# Patient Record
Sex: Female | Born: 1950 | Race: White | Hispanic: Yes | Marital: Married | Smoking: Never smoker
Health system: Southern US, Community
[De-identification: ages and names within clinical notes are randomized; demographics above are authoritative.]

## PROBLEM LIST (undated history)

## (undated) DIAGNOSIS — F32A Depression, unspecified: Secondary | ICD-10-CM

## (undated) DIAGNOSIS — F329 Major depressive disorder, single episode, unspecified: Secondary | ICD-10-CM

## (undated) DIAGNOSIS — K219 Gastro-esophageal reflux disease without esophagitis: Secondary | ICD-10-CM

## (undated) DIAGNOSIS — E119 Type 2 diabetes mellitus without complications: Secondary | ICD-10-CM

## (undated) DIAGNOSIS — C3490 Malignant neoplasm of unspecified part of unspecified bronchus or lung: Secondary | ICD-10-CM

## (undated) DIAGNOSIS — C50919 Malignant neoplasm of unspecified site of unspecified female breast: Secondary | ICD-10-CM

## (undated) DIAGNOSIS — M1990 Unspecified osteoarthritis, unspecified site: Secondary | ICD-10-CM

## (undated) DIAGNOSIS — C189 Malignant neoplasm of colon, unspecified: Secondary | ICD-10-CM

## (undated) DIAGNOSIS — F419 Anxiety disorder, unspecified: Secondary | ICD-10-CM

## (undated) HISTORY — DX: Depression, unspecified: F32.A

## (undated) HISTORY — DX: Unspecified osteoarthritis, unspecified site: M19.90

## (undated) HISTORY — DX: Type 2 diabetes mellitus without complications: E11.9

## (undated) HISTORY — PX: CATARACT EXTRACTION W/ INTRAOCULAR LENS IMPLANT: SHX1309

## (undated) HISTORY — PX: CHOLECYSTECTOMY: SHX55

## (undated) HISTORY — DX: Gastro-esophageal reflux disease without esophagitis: K21.9

## (undated) HISTORY — DX: Anxiety disorder, unspecified: F41.9

## (undated) HISTORY — DX: Malignant neoplasm of colon, unspecified: C18.9

## (undated) HISTORY — PX: COLON SURGERY: SHX602

## (undated) HISTORY — PX: MASTECTOMY: SHX3

## (undated) HISTORY — PX: CATARACT EXTRACTION: SUR2

## (undated) HISTORY — DX: Major depressive disorder, single episode, unspecified: F32.9

---

## 2014-06-07 HISTORY — PX: COLONOSCOPY: SHX174

## 2017-03-07 DIAGNOSIS — C50919 Malignant neoplasm of unspecified site of unspecified female breast: Secondary | ICD-10-CM

## 2017-03-07 HISTORY — DX: Malignant neoplasm of unspecified site of unspecified female breast: C50.919

## 2017-09-24 ENCOUNTER — Other Ambulatory Visit (HOSPITAL_COMMUNITY): Payer: Self-pay | Admitting: Internal Medicine

## 2017-09-24 DIAGNOSIS — R52 Pain, unspecified: Secondary | ICD-10-CM

## 2017-09-24 DIAGNOSIS — Z853 Personal history of malignant neoplasm of breast: Secondary | ICD-10-CM

## 2017-10-08 ENCOUNTER — Encounter (HOSPITAL_COMMUNITY): Payer: Self-pay

## 2017-10-08 ENCOUNTER — Ambulatory Visit (HOSPITAL_COMMUNITY): Payer: Self-pay

## 2017-10-21 ENCOUNTER — Encounter (HOSPITAL_COMMUNITY): Payer: Self-pay

## 2017-11-04 ENCOUNTER — Ambulatory Visit (HOSPITAL_COMMUNITY)
Admission: RE | Admit: 2017-11-04 | Discharge: 2017-11-04 | Disposition: A | Discharge status: Home / Self Care | Payer: Medicare Other | Source: Ambulatory Visit | Attending: Internal Medicine | Admitting: Internal Medicine

## 2017-11-04 ENCOUNTER — Encounter (HOSPITAL_COMMUNITY): Payer: Self-pay | Admitting: Radiology

## 2017-11-04 ENCOUNTER — Other Ambulatory Visit (HOSPITAL_COMMUNITY): Payer: Self-pay | Admitting: Internal Medicine

## 2017-11-04 DIAGNOSIS — N644 Mastodynia: Secondary | ICD-10-CM | POA: Insufficient documentation

## 2017-11-04 DIAGNOSIS — R52 Pain, unspecified: Secondary | ICD-10-CM

## 2017-11-04 DIAGNOSIS — R928 Other abnormal and inconclusive findings on diagnostic imaging of breast: Secondary | ICD-10-CM | POA: Diagnosis present

## 2017-11-04 DIAGNOSIS — Z853 Personal history of malignant neoplasm of breast: Secondary | ICD-10-CM

## 2017-11-04 HISTORY — DX: Malignant neoplasm of unspecified site of unspecified female breast: C50.919

## 2017-12-24 ENCOUNTER — Encounter: Payer: Self-pay | Admitting: Internal Medicine

## 2018-03-05 ENCOUNTER — Encounter: Payer: Self-pay | Admitting: Gastroenterology

## 2018-03-05 ENCOUNTER — Other Ambulatory Visit: Payer: Self-pay

## 2018-03-05 ENCOUNTER — Telehealth: Payer: Self-pay

## 2018-03-05 ENCOUNTER — Ambulatory Visit (INDEPENDENT_AMBULATORY_CARE_PROVIDER_SITE_OTHER): Payer: Medicare Other | Admitting: Gastroenterology

## 2018-03-05 ENCOUNTER — Encounter

## 2018-03-05 VITALS — BP 146/72 | HR 62 | Temp 97.1°F | Ht 64.0 in | Wt 244.8 lb

## 2018-03-05 DIAGNOSIS — Z85038 Personal history of other malignant neoplasm of large intestine: Secondary | ICD-10-CM | POA: Diagnosis not present

## 2018-03-05 DIAGNOSIS — R1013 Epigastric pain: Secondary | ICD-10-CM | POA: Diagnosis not present

## 2018-03-05 DIAGNOSIS — Z853 Personal history of malignant neoplasm of breast: Secondary | ICD-10-CM

## 2018-03-05 NOTE — Telephone Encounter (Signed)
Tried to call pt's daughter Romero Belling) to inform of pre-op appt 04/16/18 at 1:45pm, no answer, LMOVM. Letter mailed.

## 2018-03-05 NOTE — Progress Notes (Signed)
Primary Care Physician:  Celene Squibb, MD Primary Gastroenterologist:  Dr. Gala Romney   Chief Complaint  Patient presents with  . Abdominal Pain    HPI:   Carrie Robbins is a 67 y.o. female presenting today at the request of Dr. [place] Crane secondary to abdominal pain. She is present with her two daughters today. Patient with history of ECT many years ago with resulting deficits per daughters; they state she has the mentality of a young child with understanding. She also has a significant past medical history for colon cancer at least 10 years ago, s/p surgical resection per daughters. No chemo or radiation per daughters as they were told she would not tolerate this. Diagnosed in [place]. Colonoscopy last in 2015 normal in [place], records reviewed and to be scanned into epic. She was also diagnosed with breast cancer in June 2018, s/p mastectomy. No therapy post-surgery per daughters, as they felt any treatment would not promote quality of life. I have no records at time of visit. Surgery for breast cancer completed in [place], [place], last year. Family moved to this area in Oct 2018 after losing their home and belongings after [facility].    Notes several month history of postprandial abdominal pain. PPI increased to BID but without improvement. No N/V. Bland diet without improvement. Denies melena, hematochezia, constipation, diarrhea, dysphagia. Has been taking Naproxen.   Had been on several different types of antibiotics and seemed to get better when treated empirically for diverticulitis March 2019. Shehas lost 20 lbs since March 2019. Weights in office from outside records March 2019 at 265 lbs, today is 244.   Has lost 20 lbs since March 2019. Changed diet.   Past Medical History:  Diagnosis Date  . Anxiety   . Arthritis   . Breast cancer ([place]) 03/2017   left breast, [place]  . Cancer ([place])   . Colon cancer ([place])    about 10 years ago  . Depression   . Diabetes ([place])   . GERD  (gastroesophageal reflux disease)     Past Surgical History:  Procedure Laterality Date  . CATARACT EXTRACTION    . CATARACT EXTRACTION W/ INTRAOCULAR LENS IMPLANT    . CESAREAN SECTION     X 2  . CHOLECYSTECTOMY    . COLON SURGERY     10 years ago, colon cancer  . COLONOSCOPY  06/2014   [place]: normal-appearing anastomosis at hepatic flexure, small internal hemorrhoids, otherwise normal, surveillance 2020  . MASTECTOMY Left    with lymph node dissection    Current Outpatient Medications  Medication Sig Dispense Refill  . aspirin EC 81 MG tablet Take 81 mg by mouth daily.    Marland Kitchen atorvastatin (LIPITOR) 20 MG tablet Take 1 tablet by mouth daily.    . busPIRone (BUSPAR) 10 MG tablet Take 10 mg by mouth 3 (three) times daily.    . clonazePAM (KLONOPIN) 0.5 MG tablet Take 3 tablets by mouth daily.    Marland Kitchen FLUoxetine (PROZAC) 20 MG tablet 4 tablets daily    . fluticasone (FLONASE) 50 MCG/ACT nasal spray 2 sprays daily.    Marland Kitchen glimepiride (AMARYL) 4 MG tablet Take 2 tablets by mouth daily.    Marland Kitchen lisinopril-hydrochlorothiazide (PRINZIDE,ZESTORETIC) 20-25 MG tablet Take 1 tablet by mouth daily.  5  . naproxen (NAPROSYN) 375 MG tablet Take 2 tablets by mouth daily.    Marland Kitchen omeprazole (PRILOSEC) 40 MG capsule Take 40 mg by mouth 2 (two) times daily before a  meal.    . risperiDONE (RISPERDAL) 1 MG tablet Take 1 tablet by mouth daily.    . TRADJENTA 5 MG TABS tablet Take 1 tablet by mouth daily.  5  . traZODone (DESYREL) 50 MG tablet Take 1.5 tablets by mouth daily.    . TRESIBA FLEXTOUCH 100 UNIT/ML SOPN FlexTouch Pen 35 Units daily.     No current facility-administered medications for this visit.     Allergies as of 03/05/2018 - Review Complete 03/05/2018  Allergen Reaction Noted  . Combivent respimat [ipratropium-albuterol]  03/05/2018  . Excedrin extra strength [asa-apap-caff buffered]  03/05/2018  . Metformin and related  03/05/2018    Family History  Problem Relation Age of Onset  .  Stomach cancer Mother   . Liver cancer Maternal Grandmother   . Colon cancer Neg Hx     Social History   Socioeconomic History  . Marital status: Married    Spouse name: Not on file  . Number of children: Not on file  . Years of education: Not on file  . Highest education level: Not on file  Occupational History  . Not on file  Social Needs  . Financial resource strain: Not on file  . Food insecurity:    Worry: Not on file    Inability: Not on file  . Transportation needs:    Medical: Not on file    Non-medical: Not on file  Tobacco Use  . Smoking status: Never Smoker  . Smokeless tobacco: Never Used  Substance and Sexual Activity  . Alcohol use: Never    Frequency: Never  . Drug use: Never  . Sexual activity: Not on file  Lifestyle  . Physical activity:    Days per week: Not on file    Minutes per session: Not on file  . Stress: Not on file  Relationships  . Social connections:    Talks on phone: Not on file    Gets together: Not on file    Attends religious service: Not on file    Active member of club or organization: Not on file    Attends meetings of clubs or organizations: Not on file    Relationship status: Not on file  . Intimate partner violence:    Fear of current or ex partner: Not on file    Emotionally abused: Not on file    Physically abused: Not on file    Forced sexual activity: Not on file  Other Topics Concern  . Not on file  Social History Narrative  . Not on file    Review of Systems: Gen: see HPI   CV: Denies chest pain, heart palpitations, peripheral edema, syncope.  Resp: Denies shortness of breath at rest or with exertion. Denies wheezing or cough.  GI: see HPI  GU : Denies urinary burning, urinary frequency, urinary hesitancy MS: Denies joint pain, muscle weakness, cramps, or limitation of movement.  Derm: Denies rash, itching, dry skin Psych: Denies depression, anxiety, memory loss, and confusion Heme: Denies bruising,  bleeding, and enlarged lymph nodes.  Physical Exam: BP (!) 146/72   Pulse 62   Temp (!) 97.1 F (36.2 C) (Oral)   Ht 5\' 4"  (1.626 m)   Wt 244 lb 12.8 oz (111 kg)   BMI 42.02 kg/m  General:   Alert and oriented. Pleasant and cooperative. Well-nourished and well-developed.  Head:  Normocephalic and atraumatic. Eyes:  Without icterus, sclera clear and conjunctiva pink.  Ears:  Normal auditory acuity. Nose:  No  deformity, discharge,  or lesions. Lungs:  Clear to auscultation bilaterally. No wheezes, rales, or rhonchi. No distress.  Heart:  S1, S2 present without murmurs appreciated.  Abdomen:  +BS, soft, obese, non-tender and non-distended. No HSM noted. No guarding or rebound. No masses appreciated.  Rectal:  Deferred  Msk:  Symmetrical without gross deformities. Normal posture. Extremities:  Withoutedema. Neurologic:  Alert and  oriented x4 Psych:  Alert and cooperative. Normal mood and affect.

## 2018-03-05 NOTE — Patient Instructions (Signed)
I have ordered a CT scan first. Unless there is anything that points [place] in a different direction, we will still pursue the upper endoscopy with Dr. Gala Romney in the near future thereafter.  I would limit Naproxen if at all possible.  Continue the Prilosec twice a day.   I will attempt to get all prior records. You will need a colonoscopy in the future, but it does not appear to be due yet.  I am referring you to Oncology here at [facility] to establish care, as you will need to be followed over time.  Further recommendations to follow after the CT scan!  It was a pleasure to see you today. I strive to create trusting relationships with patients to provide genuine, compassionate, and quality care. I value your feedback. If you receive a survey regarding your visit,  I greatly appreciate you taking time to fill this out.   Annitta Needs, PhD, ANP-BC [facility]

## 2018-03-09 ENCOUNTER — Telehealth: Payer: Self-pay

## 2018-03-09 NOTE — Telephone Encounter (Signed)
AB wanted pt to have CT abd/pelvis w/contrast this week. Tried to call pt's daughter ([place]),no answer, LMOVM for return call.

## 2018-03-10 ENCOUNTER — Other Ambulatory Visit: Payer: Self-pay

## 2018-03-10 DIAGNOSIS — R1013 Epigastric pain: Secondary | ICD-10-CM

## 2018-03-10 NOTE — Telephone Encounter (Signed)
Spoke to pt's daughter. She is unable to have CT today but can go tomorrow after another 10:00am appt.  Called Central Scheduling, CT abd/pelvis w/contrast scheduled 03/11/18 at 12:30pm. Water only for 4 hours before test. Called daughter back and informed her of appt. Will arrive after other 10:00am appt to start drinking contrast.

## 2018-03-10 NOTE — Telephone Encounter (Signed)
Tried to call daughter, no answer, LMOVM for return call to schedule CT.

## 2018-03-11 ENCOUNTER — Ambulatory Visit (HOSPITAL_COMMUNITY)
Admission: RE | Admit: 2018-03-11 | Discharge: 2018-03-11 | Disposition: A | Discharge status: Home / Self Care | Payer: Medicare Other | Source: Ambulatory Visit | Attending: Gastroenterology | Admitting: Gastroenterology

## 2018-03-11 ENCOUNTER — Telehealth: Payer: Self-pay

## 2018-03-11 DIAGNOSIS — R918 Other nonspecific abnormal finding of lung field: Secondary | ICD-10-CM | POA: Diagnosis not present

## 2018-03-11 DIAGNOSIS — R1013 Epigastric pain: Secondary | ICD-10-CM | POA: Insufficient documentation

## 2018-03-11 DIAGNOSIS — I700 Atherosclerosis of aorta: Secondary | ICD-10-CM | POA: Insufficient documentation

## 2018-03-11 LAB — POCT I-STAT CREATININE: Creatinine, Ser: 1.2 mg/dL — ABNORMAL HIGH (ref 0.44–1.00)

## 2018-03-11 MED ORDER — IOPAMIDOL (ISOVUE-300) INJECTION 61%
100.0000 mL | Freq: Once | INTRAVENOUS | Status: AC | PRN
  Administered 2018-03-11: 100 mL via INTRAVENOUS

## 2018-03-11 NOTE — Telephone Encounter (Signed)
Received a call from [facility] in reference to pts CT results. Results will be faxed over. Results show possible metastasis. Orders will be placed on desk.

## 2018-03-12 NOTE — Telephone Encounter (Signed)
Noted. Addressed under result note.

## 2018-03-13 ENCOUNTER — Encounter: Payer: Self-pay | Admitting: Gastroenterology

## 2018-03-13 NOTE — Assessment & Plan Note (Signed)
Barring clinical changes, next surveillance in 2020 if health permits.

## 2018-03-13 NOTE — Assessment & Plan Note (Signed)
67 year old female with several month history of postprandial abdominal pain, no improvement with PPI, and documented weight loss. History significant for colon cancer diagnosed in another state at least 10 years ago, s/p surgical resection. Colonoscopy on file from [place] in 2015 normal. Also notable history of breast cancer in 2018 s/p mastectomy, with the choice made to avoid any chemo or radiation due to possible affect on quality of life (history of ECT in remote past with cognitive deficits). Weight loss is concerning, and I do not have any records here from prior oncology visits. Unfortunately, family lost their home in [place], [place], after [place], so care has been fragmented.   Will pursue CT now due to known history of cancers, weight loss.   Refer to Oncology to establish care Requesting all records from [facility] (where last seen by oncology last year) Pursue EGD with Dr. Gala Romney in near future. Risks, benefits, alternatives discussed with stated understanding. Propofol due to polypharmacy Continue PPI

## 2018-03-13 NOTE — Assessment & Plan Note (Signed)
Diagnosed 2018 and per daughters was metastatic. No chemo or radiation at family's request. Needs to establish care with Oncology, specifically to assess extent of disease and long-term plan especially if no treatment will be pursued. Attempting to obtain outside records.

## 2018-03-16 NOTE — Progress Notes (Signed)
CC'D TO PCP °

## 2018-03-18 NOTE — Progress Notes (Signed)
Diagnosed in 2008 with colon cancer. Underwent right hemicolectomy 2008. Final path moderately differentiated adenocarcinoma, eighteen lymph nodes negative. Colonoscopy 2009 all unremarkable. 2010 colonoscopy unremarkable.No chemo at that time. Colonoscopy 2015 in [place]: normal-appearing anastomosis at hepatic flexure. Small internal hemorrhoids.   11/2016 ultrasound-guided biopsy of left breast lesion: invasive ductal carcinoma, moderately differentiated, extensive necrosis, triple negative.   PET CT March 0375: hypermetabolic left breast mass and associated left axillary hypermetabolic lymphadenopathy, multiple indeterminate left lower lobe pulmonary nodules measuring up to 8 mm without associated significant FDG uptake but given small size may be at limits of detection of FDG activity.   PET CT May 2018: grossly stable but persistent activity in skin of left breast, the left breast mass, and 2 left axillary nodes.   03/2017 mastectomy. High-grade invasive ductal carcinoma, primary 6 cm diameter, margins negative, 3 of 14 axillary nodes positive. Stage IIB.   Family and patient decided against treatment of metastatic breast cancer.    Recent labs from PCP dated April 2019: Hgb 12.1, Cr 1.16, BUN 24, Albumin 3.5, Tbili 0.2, Alk Phos 73, AST 10, ALT 9, A1c 8.5.   Barring any clinical changes, pursue high risk surveillance colonoscopy in 2020 if health permits. Proceed with EGD. Establish care with Oncology here. Appears she did not desire treatment in 2008 for colon cancer or most recently 2018 with metastatic breast cancer. CT abd/pelvis completed here June 2019 with pulmonary nodules, which was also seen on PET CT May 2018. May not be much to offer from Oncology standpoint, but I am unsure if patient and family understood options regarding treatment when seen previously in other states. The records are difficult to follow. If EGD negative, may need to look further at mesenteric vasculature.

## 2018-03-20 ENCOUNTER — Telehealth: Payer: Self-pay | Admitting: Internal Medicine

## 2018-03-20 NOTE — Telephone Encounter (Signed)
[phone]  PLEASE CALL, MEDICATION ANNA PRESCRIBED BROKE HER OUT INA RASH AND SHE HAD TO TAKE A BENADRYL   PLEASE ADVISE

## 2018-03-20 NOTE — Telephone Encounter (Signed)
Lmom, waiting on a return call.  

## 2018-03-23 NOTE — Telephone Encounter (Signed)
Spoke to pts daughter. The medication they were inquiring about came from another provider not in our office.

## 2018-03-25 ENCOUNTER — Other Ambulatory Visit: Payer: Self-pay | Admitting: Internal Medicine

## 2018-03-25 DIAGNOSIS — R634 Abnormal weight loss: Secondary | ICD-10-CM

## 2018-03-25 DIAGNOSIS — R911 Solitary pulmonary nodule: Secondary | ICD-10-CM

## 2018-03-26 ENCOUNTER — Ambulatory Visit (HOSPITAL_COMMUNITY): Admission: RE | Admit: 2018-03-26 | Payer: Medicare Other | Source: Ambulatory Visit

## 2018-03-31 ENCOUNTER — Ambulatory Visit
Admission: RE | Admit: 2018-03-31 | Discharge: 2018-03-31 | Disposition: A | Discharge status: Home / Self Care | Payer: Medicare Other | Source: Ambulatory Visit | Attending: Internal Medicine | Admitting: Internal Medicine

## 2018-03-31 DIAGNOSIS — C771 Secondary and unspecified malignant neoplasm of intrathoracic lymph nodes: Secondary | ICD-10-CM | POA: Diagnosis not present

## 2018-03-31 DIAGNOSIS — R918 Other nonspecific abnormal finding of lung field: Secondary | ICD-10-CM | POA: Insufficient documentation

## 2018-03-31 DIAGNOSIS — R911 Solitary pulmonary nodule: Secondary | ICD-10-CM | POA: Diagnosis present

## 2018-03-31 LAB — POCT I-STAT CREATININE: CREATININE: 0.9 mg/dL (ref 0.44–1.00)

## 2018-03-31 MED ORDER — IOPAMIDOL (ISOVUE-300) INJECTION 61%
75.0000 mL | Freq: Once | INTRAVENOUS | Status: AC | PRN
  Administered 2018-03-31: 75 mL via INTRAVENOUS

## 2018-04-03 ENCOUNTER — Encounter (HOSPITAL_COMMUNITY): Payer: Self-pay | Admitting: Hematology

## 2018-04-03 ENCOUNTER — Inpatient Hospital Stay (HOSPITAL_COMMUNITY): Payer: Medicare Other | Attending: Hematology | Admitting: Hematology

## 2018-04-03 DIAGNOSIS — Z85038 Personal history of other malignant neoplasm of large intestine: Secondary | ICD-10-CM | POA: Insufficient documentation

## 2018-04-03 DIAGNOSIS — Z7189 Other specified counseling: Secondary | ICD-10-CM

## 2018-04-03 DIAGNOSIS — Z853 Personal history of malignant neoplasm of breast: Secondary | ICD-10-CM | POA: Diagnosis present

## 2018-04-03 DIAGNOSIS — C50919 Malignant neoplasm of unspecified site of unspecified female breast: Secondary | ICD-10-CM | POA: Insufficient documentation

## 2018-04-03 NOTE — Progress Notes (Signed)
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  HISTORY OF PRESENTING ILLNESS:  Carrie Robbins 67 y.o. female is seen in consultation today for further management of possible metastatic disease to the lungs and mediastinum.  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  She also had a history of colon cancer which was resected in February 2009, her latest colonoscopy in 2015 was within normal limits.  As she was having a lot of abdominal symptoms including abdominal pain after eating, bloating after eating, 30 pound weight loss in the last 2 months, CT scan of the abdomen and pelvis was done on 03/11/2018 which showed incidental lung nodules.  No major abdominal findings were noted.  A subsequent CT scan of the chest on 03/31/2018 shows multiple bilateral lung nodules with mediastinal adenopathy.  She also has a EGD scheduled on 04/12/2018.  She does not have a good mental status since she had multiple ECTs done for schizophrenia and severe depression at age 70.  She needs help in all her ADLs and IADLs.  1 of the daughters accompanying her today is her primary caregiver.  Because of her poor mental and functional status, adjuvant chemotherapy after breast resection was declined.  She does not report any new onset pains at this time.  She has chronic hip and knee pain secondary to arthritis.  She moved to [place] area along with her daughter's in October 2018.  She lost her husband recently.  MEDICAL HISTORY:  Past Medical History:  Diagnosis Date  . Anxiety   . Arthritis   . Breast cancer ([place]) 03/2017   left breast, [place]  . Colon cancer ([place])    about 10 years ago  . Depression   . Diabetes ([place])    . GERD (gastroesophageal reflux disease)     SURGICAL HISTORY: Past Surgical History:  Procedure Laterality Date  . CATARACT EXTRACTION    . CATARACT EXTRACTION W/ INTRAOCULAR LENS IMPLANT    . CESAREAN SECTION     X 2  . CHOLECYSTECTOMY    . COLON SURGERY     10 years ago, colon cancer  . COLONOSCOPY  06/2014   [place]: normal-appearing anastomosis at hepatic flexure, small internal hemorrhoids, otherwise normal, surveillance 2020  . MASTECTOMY Left    with lymph node dissection    SOCIAL HISTORY: Social History   Socioeconomic History  . Marital status: Married    Spouse name: Not on file  . Number of children: Not on file  . Years of education: Not on file  . Highest education level: Not on file  Occupational History  . Not on file  Social Needs  . Financial resource strain: Not on file  . Food insecurity:    Worry: Not on file    Inability: Not on file  . Transportation needs:    Medical: Not on file    Non-medical: Not on file  Tobacco Use  . Smoking status: Never Smoker  . Smokeless tobacco: Never Used  Substance and Sexual Activity  . Alcohol use: Never    Frequency: Never  . Drug use: Never  . Sexual activity: Not on file  Lifestyle  . Physical activity:    Days per week: Not on  file    Minutes per session: Not on file  . Stress: Not on file  Relationships  . Social connections:    Talks on phone: Not on file    Gets together: Not on file    Attends religious service: Not on file    Active member of club or organization: Not on file    Attends meetings of clubs or organizations: Not on file    Relationship status: Not on file  . Intimate partner violence:    Fear of current or ex partner: Not on file    Emotionally abused: Not on file    Physically abused: Not on file    Forced sexual activity: Not on file  Other Topics Concern  . Not on file  Social History Narrative  . Not on file    FAMILY HISTORY: Family History  Problem Relation  Age of Onset  . Stomach cancer Mother   . Liver cancer Maternal Grandmother   . Colon cancer Neg Hx     ALLERGIES:  is allergic to combivent respimat [ipratropium-albuterol]; excedrin extra strength [asa-apap-caff buffered]; and metformin and related.  MEDICATIONS:  Current Outpatient Medications  Medication Sig Dispense Refill  . aspirin EC 81 MG tablet Take 81 mg by mouth daily.    Marland Kitchen atorvastatin (LIPITOR) 20 MG tablet Take 1 tablet by mouth daily.    . busPIRone (BUSPAR) 10 MG tablet Take 10 mg by mouth 3 (three) times daily.    . clonazePAM (KLONOPIN) 0.5 MG tablet Take 3 tablets by mouth daily.    Marland Kitchen FLUoxetine (PROZAC) 20 MG tablet 4 tablets daily    . fluticasone (FLONASE) 50 MCG/ACT nasal spray 2 sprays daily.    Marland Kitchen glimepiride (AMARYL) 4 MG tablet Take 2 tablets by mouth daily.    Marland Kitchen lisinopril-hydrochlorothiazide (PRINZIDE,ZESTORETIC) 20-25 MG tablet Take 1 tablet by mouth daily.  5  . omeprazole (PRILOSEC) 40 MG capsule Take 40 mg by mouth 2 (two) times daily before a meal.    . risperiDONE (RISPERDAL) 1 MG tablet Take 1 tablet by mouth daily.    . TRADJENTA 5 MG TABS tablet Take 1 tablet by mouth daily.  5  . traMADol (ULTRAM) 50 MG tablet Take 50 mg by mouth 4 (four) times daily.    . traZODone (DESYREL) 50 MG tablet Take 1.5 tablets by mouth daily.    . TRESIBA FLEXTOUCH 100 UNIT/ML SOPN FlexTouch Pen 35 Units daily.     No current facility-administered medications for this visit.     REVIEW OF SYSTEMS:   Constitutional: Denies fevers, chills or abnormal night sweats.  30 pound weight loss in the last 2 months. Eyes: Denies blurriness of vision, double vision or watery eyes Ears, nose, mouth, throat, and face: Denies mucositis or sore throat Respiratory: Denies cough, dyspnea or wheezes Cardiovascular: Denies palpitation, chest discomfort or lower extremity swelling Gastrointestinal: Bloating sensation after eating.  There is also pain in the epigastric region after  eating.   Skin: Denies abnormal skin rashes Lymphatics: Denies new lymphadenopathy or easy bruising Neurological:Denies numbness, tingling or new weaknesses Behavioral/Psych: Mood is stable, no new changes  All other systems were reviewed with the patient and are negative.  PHYSICAL EXAMINATION: ECOG PERFORMANCE STATUS: 2 - Symptomatic, <50% confined to bed  Vitals:   04/03/18 1254  BP: (!) 156/55  Pulse: 67  Resp: 16  Temp: 98.4 F (36.9 C)  SpO2: 97%   Filed Weights   04/03/18 1254  Weight: 260 lb 9.6  oz (118.2 kg)    GENERAL:alert, no distress and comfortable SKIN: skin color, texture, turgor are normal, no rashes or significant lesions EYES: normal, conjunctiva are pink and non-injected, sclera clear OROPHARYNX:no exudate, no erythema and lips, buccal mucosa, and tongue normal  NECK: supple, thyroid normal size, non-tender, without nodularity LYMPH:  no palpable lymphadenopathy in the cervical, axillary or inguinal LUNGS: clear to auscultation and percussion with normal breathing effort HEART: regular rate & rhythm and no murmurs and no lower extremity edema ABDOMEN:abdomen soft, non-tender and normal bowel sounds PSYCH: alert & oriented x 3 with fluent speech Breast examination: Left mastectomy site does not have any palpable nodules of local recurrence.  Right breast has no palpable masses.   LABORATORY DATA:  I have reviewed the data as listed No results found for: WBC, HGB, HCT, MCV, PLT   Chemistry      Component Value Date/Time   CREATININE 0.90 03/31/2018 1134   No results found for: CALCIUM, ALKPHOS, AST, ALT, BILITOT     RADIOGRAPHIC STUDIES: I have personally reviewed the radiological images as listed and agreed with the findings in the report. Ct Chest W Contrast  Result Date: 03/31/2018 CLINICAL DATA:  Pulmonary nodules.  For history of colon cancer EXAM: CT CHEST WITH CONTRAST TECHNIQUE: Multidetector CT imaging of the chest was performed during  intravenous contrast administration. CONTRAST:  86mL ISOVUE-300 IOPAMIDOL (ISOVUE-300) INJECTION 61% COMPARISON:  CT 03/11/2018 FINDINGS: Cardiovascular: No significant vascular findings. Normal heart size. No pericardial effusion. Mediastinum/Nodes: No axillary or supraclavicular adenopathy. Enlarged mediastinal lymph nodes. For example 17 mm short axis RIGHT lower paratracheal lymph node. no supraclavicular adenopathy. Lungs/Pleura: Multiple bilateral round pulmonary nodules of varying sizes in the upper and lower lobes. Largest nodule in the LEFT upper lobe measures 2.4 cm. Similar nodule in the LEFT lower lobe measures 2.4 cm. RIGHT upper lobe nodule measures 11 mm. Approximately 6 nodules per lung. Upper Abdomen: No focal hepatic lesion. No upper abdominal adenopathy. Adrenal glands normal. Musculoskeletal: No aggressive osseous lesion. IMPRESSION: 1. Bilateral large pulmonary nodules presumed colorectal carcinoma metastasis. 2. Metastatic mediastinal adenopathy. Electronically Signed   By: Suzy Bouchard M.D.   On: 03/31/2018 12:24   Ct Abdomen Pelvis W Contrast  Result Date: 03/11/2018 CLINICAL DATA:  Epigastric abdominal pain for several months. History of breast cancer. EXAM: CT ABDOMEN AND PELVIS WITH CONTRAST TECHNIQUE: Multidetector CT imaging of the abdomen and pelvis was performed using the standard protocol following bolus administration of intravenous contrast. CONTRAST:  146mL ISOVUE-300 IOPAMIDOL (ISOVUE-300) INJECTION 61% COMPARISON:  None. FINDINGS: Lower chest: Multiple pulmonary nodules are noted in the visualized lung bases concerning for metastatic disease. Hepatobiliary: No focal liver abnormality is seen. Status post cholecystectomy. No biliary dilatation. Pancreas: Unremarkable. No pancreatic ductal dilatation or surrounding inflammatory changes. Spleen: Normal in size without focal abnormality. Adrenals/Urinary Tract: Adrenal glands appear normal. Probable 1 cm angiomyolipoma seen in  lower pole of right kidney. No hydronephrosis or renal obstruction is noted. No renal or ureteral calculi are noted. Urinary bladder appears normal. Stomach/Bowel: The stomach appears normal. There is no evidence of bowel obstruction or inflammation. The appendix is not visualized. Vascular/Lymphatic: Aortic atherosclerosis. No enlarged abdominal or pelvic lymph nodes. Reproductive: Uterus and bilateral adnexa are unremarkable. Other: No abdominal wall hernia or abnormality. No abdominopelvic ascites. Musculoskeletal: No acute or significant osseous findings. IMPRESSION: Multiple pulmonary nodules are noted in the lung bases consistent with metastatic disease. These results will be called to the ordering clinician or representative by the  Psychologist, clinical, and communication documented in the PACS or zVision Dashboard. 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 Aortic Atherosclerosis (ICD10-I70.0). Electronically Signed   By: Marijo Conception, M.D.   On: 03/11/2018 15:02    ASSESSMENT & PLAN:  Metastatic breast cancer ([place]) 1.  Presumed metastatic triple negative breast cancer to the lungs and mediastinal adenopathy: - She had stage IIIb (pT4 pN1 M0) grade 3 triple negative breast cancer of the left breast resected by left mastectomy and lymph node dissection on 03/17/2017 at [facility] in [place].  Pre-surgery PET CT scan on 02/27/2017 was negative for distant metastasis.  Because of her psychiatric history and poor performance status, she opted not to pursue any adjuvant chemotherapy. -Lately she is having a lot of fatigue, bloating and hardening of her belly after eating, 30 pound weight loss in the last 2 months, abdominal pain after eating.  A CT scan of the abdomen and pelvis was ordered on 03/11/2018 which showed lung masses incidentally.  A CT of the chest was subsequently done on 03/31/2018 which shows multiple lung masses with mediastinal adenopathy. -She also had  history of colon cancer, unknown stage, likely high-grade stage II or stage III resected and February 2009, reportedly offered chemotherapy but was declined at the time due to the same reasons as mentioned above. - I think that the lung lesions and mediastinal adenopathy are highly likely from triple negative breast cancer rather than colon cancer 10 years ago.  I believe that she is not a candidate for any chemotherapy given her mental status and performance status.  Patient's daughter is her primary caregiver.  Both daughters agree with my assessment and recommendations.  I have also talked about palliative care/hospice care.  They would like to think about it.  I do not think any biopsy or other blood work is needed at this time.  I am not giving any follow-up appointment, but would be happy to see her back if needed.  No orders of the defined types were placed in this encounter.   All questions were answered. The patient knows to call the clinic with any problems, questions or concerns. Total time spent is 60 minutes with more than 50% of the time spent face-to-face discussing scan results, likely diagnosis, prognosis, palliative/hospice care options.     Derek Jack, MD 04/03/2018 4:23 PM

## 2018-04-03 NOTE — Patient Instructions (Signed)
[facility] Discharge Instructions  You saw Dr. Katragadda today.   Thank you for choosing [facility] at [facility] to provide your oncology and hematology care.  To afford each patient quality time with our provider, please arrive at least 15 minutes before your scheduled appointment time.   If you have a lab appointment with the Cancer Center please come in thru the  Main Entrance and check in at the main information desk  You need to re-schedule your appointment should you arrive 10 or more minutes late.  We strive to give you quality time with our providers, and arriving late affects you and other patients whose appointments are after yours.  Also, if you no show three or more times for appointments you may be dismissed from the clinic at the providers discretion.     Again, thank you for choosing [facility].  Our hope is that these requests will decrease the amount of time that you wait before being seen by our physicians.       _____________________________________________________________  Should you have questions after your visit to [facility], please contact our office at [phone] between the hours of 8:30 a.m. and 4:30 p.m.  Voicemails left after 4:30 p.m. will not be returned until the following business day.  For prescription refill requests, have your pharmacy contact our office.       Resources For Cancer Patients and their Caregivers ? American Cancer Society: Can assist with transportation, wigs, general needs, runs Look Good Feel Better.        1-888-227-6333 ? Cancer Care: Provides financial assistance, online support groups, medication/co-pay assistance.  1-800-813-HOPE (4673) ? [facility] Assists [place] cancer patients and their families through emotional , educational and financial support.  336-427-4357 ? [place] DSS Where to apply for  food stamps, Medicaid and utility assistance. 336-342-1394 ? [facility]: Transportation to medical appointments. 336-347-2287 ? Social Security Administration: May apply for disability if have a Stage IV cancer. 336-342-7796 1-800-772-1213 ? [place] Aging, Disability and Transit Services: Assists with nutrition, care and transit needs. 336-349-2343  Cancer Center Support Programs:   > Cancer Support Group  2nd Tuesday of the month 1pm-2pm, Journey Room   > Creative Journey  3rd Tuesday of the month 1130am-1pm, Journey Room     

## 2018-04-03 NOTE — Assessment & Plan Note (Addendum)
1.  Presumed metastatic triple negative breast cancer to the lungs and mediastinal adenopathy: - She had stage IIIb (pT4 pN1 M0) grade 3 triple negative breast cancer of the left breast resected by left mastectomy and lymph node dissection on 03/17/2017 at [facility] in [place].  Pre-surgery PET CT scan on 02/27/2017 was negative for distant metastasis.  Because of her psychiatric history and poor performance status, she opted not to pursue any adjuvant chemotherapy. -Lately she is having a lot of fatigue, bloating and hardening of her belly after eating, 30 pound weight loss in the last 2 months, abdominal pain after eating.  A CT scan of the abdomen and pelvis was ordered on 03/11/2018 which showed lung masses incidentally.  A CT of the chest was subsequently done on 03/31/2018 which shows multiple lung masses with mediastinal adenopathy. -She also had history of colon cancer, unknown stage, likely high-grade stage II or stage III resected and February 2009, reportedly offered chemotherapy but was declined at the time due to the same reasons as mentioned above. - I think that the lung lesions and mediastinal adenopathy are highly likely from triple negative breast cancer rather than colon cancer 10 years ago.  I believe that she is not a candidate for any chemotherapy given her mental status and performance status.  Patient's daughter is her primary caregiver.  Both daughters agree with my assessment and recommendations.  I have also talked about palliative care/hospice care.  They would like to think about it.  I have also talked to them about advanced directives.  I do not think any biopsy or other blood work is needed at this time.  I am not giving any follow-up appointment, but would be happy to see her back if needed.

## 2018-04-14 ENCOUNTER — Ambulatory Visit (HOSPITAL_COMMUNITY): Payer: Medicare Other | Admitting: Hematology

## 2018-04-14 ENCOUNTER — Telehealth: Payer: Self-pay | Admitting: Internal Medicine

## 2018-04-14 NOTE — Telephone Encounter (Signed)
LMOVM

## 2018-04-14 NOTE — Telephone Encounter (Signed)
[phone] PLEASE CALL PATIENT DAUGHTER FAWN, MOTHER IS SICK AND ON ANTIBIOTICS AND WANTS TO KNOW IF SHE CAN STILL HAVE HER PROCEDURE DONE.

## 2018-04-14 NOTE — Telephone Encounter (Signed)
Spoke with patient daughter. Patient recently started 2 antibiotics and is on codeien cough syrup. Patient unable to have procedure done on 04/23/18. Patient has been r/s'd to 05/18/18 at 2:45pm. New pre-op scheduled for 05/12/18 at 10:00am. Daughter aware will mail new instructions and appt information to them.

## 2018-04-16 ENCOUNTER — Inpatient Hospital Stay (HOSPITAL_COMMUNITY): Admission: RE | Admit: 2018-04-16 | Payer: Medicare Other | Source: Ambulatory Visit

## 2018-05-07 NOTE — Patient Instructions (Signed)
Dan Dissinger  05/07/2018     @PREFPERIOPPHARMACY @   Your procedure is scheduled on  05/18/2018   Report to [facility] at  1:15   P.M.  Call this number if you have problems the morning of surgery:  [phone]   Remember:  Do not eat or drink after midnight.  You may drink clear liquids until  (follow the instructions given to you) .  Clear liquids allowed are:                    Water, Juice (non-citric and without pulp), Carbonated beverages, Clear Tea, Black Coffee only, Plain Jell-O only, Gatorade and Plain Popsicles only    Take these medicines the morning of surgery with A SIP OF WATER  Buspar, clonazepam, prozac, lisinopril, risperdal, ultram (if needed). Take 1/2 of your insulin the night before and none the morning of your procedure.    Do not wear jewelry, make-up or nail polish.  Do not wear lotions, powders, or perfumes, or deodorant.  Do not shave 48 hours prior to surgery.  Men may shave face and neck.  Do not bring valuables to the hospital.  [facility] is not responsible for any belongings or valuables.  Contacts, dentures or bridgework may not be worn into surgery.  Leave your suitcase in the car.  After surgery it may be brought to your room.  For patients admitted to the hospital, discharge time will be determined by your treatment team.  Patients discharged the day of surgery will not be allowed to drive home.   Name and phone number of your driver:   family Special instructions:  Follow the diet and prep instructions given to you by Dr Roseanne Kaufman office.  Please read over the following fact sheets that you were given. Anesthesia Post-op Instructions and Care and Recovery After Surgery       Esophagogastroduodenoscopy Esophagogastroduodenoscopy (EGD) is a procedure to examine the lining of the esophagus, stomach, and first part of the small intestine (duodenum). This procedure is done to check for problems such as inflammation, bleeding,  ulcers, or growths. During this procedure, a long, flexible, lighted tube with a camera attached (endoscope) is inserted down the throat. Tell a health care provider about:  Any allergies you have.  All medicines you are taking, including vitamins, herbs, eye drops, creams, and over-the-counter medicines.  Any problems you or family members have had with anesthetic medicines.  Any blood disorders you have.  Any surgeries you have had.  Any medical conditions you have.  Whether you are pregnant or may be pregnant. What are the risks? Generally, this is a safe procedure. However, problems may occur, including:  Infection.  Bleeding.  A tear (perforation) in the esophagus, stomach, or duodenum.  Trouble breathing.  Excessive sweating.  Spasms of the larynx.  A slowed heartbeat.  Low blood pressure.  What happens before the procedure?  Follow instructions from your health care provider about eating or drinking restrictions.  Ask your health care provider about: ? Changing or stopping your regular medicines. This is especially important if you are taking diabetes medicines or blood thinners. ? Taking medicines such as aspirin and ibuprofen. These medicines can thin your blood. Do not take these medicines before your procedure if your health care provider instructs you not to.  Plan to have someone take you home after the procedure.  If you wear dentures, be ready to remove them  before the procedure. What happens during the procedure?  To reduce your risk of infection, your health care team will wash or sanitize their hands.  An IV tube will be put in a vein in your hand or arm. You will get medicines and fluids through this tube.  You will be given one or more of the following: ? A medicine to help you relax (sedative). ? A medicine to numb the area (local anesthetic). This medicine may be sprayed into your throat. It will make you feel more comfortable and keep you  from gagging or coughing during the procedure. ? A medicine for pain.  A mouth guard may be placed in your mouth to protect your teeth and to keep you from biting on the endoscope.  You will be asked to lie on your left side.  The endoscope will be lowered down your throat into your esophagus, stomach, and duodenum.  Air will be put into the endoscope. This will help your health care provider see better.  The lining of your esophagus, stomach, and duodenum will be examined.  Your health care provider may: ? Take a tissue sample so it can be looked at in a lab (biopsy). ? Remove growths. ? Remove objects (foreign bodies) that are stuck. ? Treat any bleeding with medicines or other devices that stop tissue from bleeding. ? Widen (dilate) or stretch narrowed areas of your esophagus and stomach.  The endoscope will be taken out. The procedure may vary among health care providers and hospitals. What happens after the procedure?  Your blood pressure, heart rate, breathing rate, and blood oxygen level will be monitored often until the medicines you were given have worn off.  Do not eat or drink anything until the numbing medicine has worn off and your gag reflex has returned. This information is not intended to replace advice given to you by your health care provider. Make sure you discuss any questions you have with your health care provider. Document Released: 01/24/2005 Document Revised: 02/29/2016 Document Reviewed: 08/17/2015 Elsevier Interactive Patient Education  2018 [employer]. Esophagogastroduodenoscopy, Care After Refer to this sheet in the next few weeks. These instructions provide you with information about caring for yourself after your procedure. Your health care provider may also give you more specific instructions. Your treatment has been planned according to current medical practices, but problems sometimes occur. Call your health care provider if you have any problems or  questions after your procedure. What can I expect after the procedure? After the procedure, it is common to have:  A sore throat.  Nausea.  Bloating.  Dizziness.  Fatigue.  Follow these instructions at home:  Do not eat or drink anything until the numbing medicine (local anesthetic) has worn off and your gag reflex has returned. You will know that the local anesthetic has worn off when you can swallow comfortably.  Do not drive for 24 hours if you received a medicine to help you relax (sedative).  If your health care provider took a tissue sample for testing during the procedure, make sure to get your test results. This is your responsibility. Ask your health care provider or the department performing the test when your results will be ready.  Keep all follow-up visits as told by your health care provider. This is important. Contact a health care provider if:  You cannot stop coughing.  You are not urinating.  You are urinating less than usual. Get help right away if:  You have trouble  swallowing.  You cannot eat or drink.  You have throat or chest pain that gets worse.  You are dizzy or light-headed.  You faint.  You have nausea or vomiting.  You have chills.  You have a fever.  You have severe abdominal pain.  You have black, tarry, or bloody stools. This information is not intended to replace advice given to you by your health care provider. Make sure you discuss any questions you have with your health care provider. Document Released: 09/09/2012 Document Revised: 02/29/2016 Document Reviewed: 08/17/2015 Elsevier Interactive Patient Education  2018 [place] Anesthesia is a term that refers to techniques, procedures, and medicines that help a person stay safe and comfortable during a medical procedure. Monitored anesthesia care, or sedation, is one type of anesthesia. Your anesthesia specialist may recommend sedation if you  will be having a procedure that does not require you to be unconscious, such as:  Cataract surgery.  A dental procedure.  A biopsy.  A colonoscopy.  During the procedure, you may receive a medicine to help you relax (sedative). There are three levels of sedation:  Mild sedation. At this level, you may feel awake and relaxed. You will be able to follow directions.  Moderate sedation. At this level, you will be sleepy. You may not remember the procedure.  Deep sedation. At this level, you will be asleep. You will not remember the procedure.  The more medicine you are given, the deeper your level of sedation will be. Depending on how you respond to the procedure, the anesthesia specialist may change your level of sedation or the type of anesthesia to fit your needs. An anesthesia specialist will monitor you closely during the procedure. Let your health care provider know about:  Any allergies you have.  All medicines you are taking, including vitamins, herbs, eye drops, creams, and over-the-counter medicines.  Any use of steroids (by mouth or as a cream).  Any problems you or family members have had with sedatives and anesthetic medicines.  Any blood disorders you have.  Any surgeries you have had.  Any medical conditions you have, such as sleep apnea.  Whether you are pregnant or may be pregnant.  Any use of cigarettes, alcohol, or street drugs. What are the risks? Generally, this is a safe procedure. However, problems may occur, including:  Getting too much medicine (oversedation).  Nausea.  Allergic reaction to medicines.  Trouble breathing. If this happens, a breathing tube may be used to help with breathing. It will be removed when you are awake and breathing on your own.  Heart trouble.  Lung trouble.  Before the procedure Staying hydrated Follow instructions from your health care provider about hydration, which may include:  Up to 2 hours before the  procedure - you may continue to drink clear liquids, such as water, clear fruit juice, black coffee, and plain tea.  Eating and drinking restrictions Follow instructions from your health care provider about eating and drinking, which may include:  8 hours before the procedure - stop eating heavy meals or foods such as meat, fried foods, or fatty foods.  6 hours before the procedure - stop eating light meals or foods, such as toast or cereal.  6 hours before the procedure - stop drinking milk or drinks that contain milk.  2 hours before the procedure - stop drinking clear liquids.  Medicines Ask your health care provider about:  Changing or stopping your regular medicines. This is especially  important if you are taking diabetes medicines or blood thinners.  Taking medicines such as aspirin and ibuprofen. These medicines can thin your blood. Do not take these medicines before your procedure if your health care provider instructs you not to.  Tests and exams  You will have a physical exam.  You may have blood tests done to show: ? How well your kidneys and liver are working. ? How well your blood can clot.  General instructions  Plan to have someone take you home from the hospital or clinic.  If you will be going home right after the procedure, plan to have someone with you for 24 hours.  What happens during the procedure?  Your blood pressure, heart rate, breathing, level of pain and overall condition will be monitored.  An IV tube will be inserted into one of your veins.  Your anesthesia specialist will give you medicines as needed to keep you comfortable during the procedure. This may mean changing the level of sedation.  The procedure will be performed. After the procedure  Your blood pressure, heart rate, breathing rate, and blood oxygen level will be monitored until the medicines you were given have worn off.  Do not drive for 24 hours if you received a  sedative.  You may: ? Feel sleepy, clumsy, or nauseous. ? Feel forgetful about what happened after the procedure. ? Have a sore throat if you had a breathing tube during the procedure. ? Vomit. This information is not intended to replace advice given to you by your health care provider. Make sure you discuss any questions you have with your health care provider. Document Released: 06/19/2005 Document Revised: 03/01/2016 Document Reviewed: 01/14/2016 Elsevier Interactive Patient Education  2018 [place], Care After These instructions provide you with information about caring for yourself after your procedure. Your health care provider may also give you more specific instructions. Your treatment has been planned according to current medical practices, but problems sometimes occur. Call your health care provider if you have any problems or questions after your procedure. What can I expect after the procedure? After your procedure, it is common to:  Feel sleepy for several hours.  Feel clumsy and have poor balance for several hours.  Feel forgetful about what happened after the procedure.  Have poor judgment for several hours.  Feel nauseous or vomit.  Have a sore throat if you had a breathing tube during the procedure.  Follow these instructions at home: For at least 24 hours after the procedure:   Do not: ? Participate in activities in which you could fall or become injured. ? Drive. ? Use heavy machinery. ? Drink alcohol. ? Take sleeping pills or medicines that cause drowsiness. ? Make important decisions or sign legal documents. ? Take care of children on your own.  Rest. Eating and drinking  Follow the diet that is recommended by your health care provider.  If you vomit, drink water, juice, or soup when you can drink without vomiting.  Make sure you have little or no nausea before eating solid foods. General instructions  Have a  responsible adult stay with you until you are awake and alert.  Take over-the-counter and prescription medicines only as told by your health care provider.  If you smoke, do not smoke without supervision.  Keep all follow-up visits as told by your health care provider. This is important. Contact a health care provider if:  You keep feeling nauseous or you keep vomiting.  You feel light-headed.  You develop a rash.  You have a fever. Get help right away if:  You have trouble breathing. This information is not intended to replace advice given to you by your health care provider. Make sure you discuss any questions you have with your health care provider. Document Released: 01/14/2016 Document Revised: 05/15/2016 Document Reviewed: 01/14/2016 Elsevier Interactive Patient Education  [employer].

## 2018-05-09 ENCOUNTER — Emergency Department (HOSPITAL_COMMUNITY): Payer: Medicare Other

## 2018-05-09 ENCOUNTER — Observation Stay (HOSPITAL_COMMUNITY)
Admission: EM | Admit: 2018-05-09 | Discharge: 2018-05-10 | Disposition: A | Discharge status: Hospice | Payer: Medicare Other | Attending: Internal Medicine | Admitting: Internal Medicine

## 2018-05-09 ENCOUNTER — Other Ambulatory Visit: Payer: Self-pay

## 2018-05-09 ENCOUNTER — Encounter (HOSPITAL_COMMUNITY): Payer: Self-pay

## 2018-05-09 DIAGNOSIS — Z7984 Long term (current) use of oral hypoglycemic drugs: Secondary | ICD-10-CM | POA: Insufficient documentation

## 2018-05-09 DIAGNOSIS — E1165 Type 2 diabetes mellitus with hyperglycemia: Secondary | ICD-10-CM | POA: Diagnosis present

## 2018-05-09 DIAGNOSIS — Z9012 Acquired absence of left breast and nipple: Secondary | ICD-10-CM | POA: Insufficient documentation

## 2018-05-09 DIAGNOSIS — R05 Cough: Secondary | ICD-10-CM | POA: Insufficient documentation

## 2018-05-09 DIAGNOSIS — F419 Anxiety disorder, unspecified: Secondary | ICD-10-CM | POA: Diagnosis not present

## 2018-05-09 DIAGNOSIS — F39 Unspecified mood [affective] disorder: Secondary | ICD-10-CM | POA: Diagnosis not present

## 2018-05-09 DIAGNOSIS — R591 Generalized enlarged lymph nodes: Secondary | ICD-10-CM | POA: Diagnosis not present

## 2018-05-09 DIAGNOSIS — Z794 Long term (current) use of insulin: Secondary | ICD-10-CM

## 2018-05-09 DIAGNOSIS — N179 Acute kidney failure, unspecified: Secondary | ICD-10-CM | POA: Insufficient documentation

## 2018-05-09 DIAGNOSIS — R404 Transient alteration of awareness: Secondary | ICD-10-CM | POA: Diagnosis not present

## 2018-05-09 DIAGNOSIS — I10 Essential (primary) hypertension: Secondary | ICD-10-CM | POA: Diagnosis not present

## 2018-05-09 DIAGNOSIS — C7802 Secondary malignant neoplasm of left lung: Secondary | ICD-10-CM | POA: Diagnosis not present

## 2018-05-09 DIAGNOSIS — F329 Major depressive disorder, single episode, unspecified: Secondary | ICD-10-CM | POA: Diagnosis not present

## 2018-05-09 DIAGNOSIS — R6510 Systemic inflammatory response syndrome (SIRS) of non-infectious origin without acute organ dysfunction: Secondary | ICD-10-CM | POA: Diagnosis present

## 2018-05-09 DIAGNOSIS — C7801 Secondary malignant neoplasm of right lung: Secondary | ICD-10-CM | POA: Insufficient documentation

## 2018-05-09 DIAGNOSIS — E875 Hyperkalemia: Secondary | ICD-10-CM | POA: Diagnosis not present

## 2018-05-09 DIAGNOSIS — M1990 Unspecified osteoarthritis, unspecified site: Secondary | ICD-10-CM | POA: Diagnosis not present

## 2018-05-09 DIAGNOSIS — Z7982 Long term (current) use of aspirin: Secondary | ICD-10-CM | POA: Diagnosis not present

## 2018-05-09 DIAGNOSIS — Z886 Allergy status to analgesic agent status: Secondary | ICD-10-CM | POA: Insufficient documentation

## 2018-05-09 DIAGNOSIS — Z85038 Personal history of other malignant neoplasm of large intestine: Secondary | ICD-10-CM | POA: Diagnosis present

## 2018-05-09 DIAGNOSIS — K219 Gastro-esophageal reflux disease without esophagitis: Secondary | ICD-10-CM | POA: Insufficient documentation

## 2018-05-09 DIAGNOSIS — F209 Schizophrenia, unspecified: Secondary | ICD-10-CM | POA: Diagnosis not present

## 2018-05-09 DIAGNOSIS — R600 Localized edema: Secondary | ICD-10-CM | POA: Insufficient documentation

## 2018-05-09 DIAGNOSIS — R0603 Acute respiratory distress: Secondary | ICD-10-CM | POA: Insufficient documentation

## 2018-05-09 DIAGNOSIS — Z853 Personal history of malignant neoplasm of breast: Secondary | ICD-10-CM | POA: Insufficient documentation

## 2018-05-09 DIAGNOSIS — Z79899 Other long term (current) drug therapy: Secondary | ICD-10-CM | POA: Insufficient documentation

## 2018-05-09 DIAGNOSIS — D649 Anemia, unspecified: Secondary | ICD-10-CM | POA: Diagnosis present

## 2018-05-09 DIAGNOSIS — Z66 Do not resuscitate: Secondary | ICD-10-CM | POA: Diagnosis not present

## 2018-05-09 DIAGNOSIS — Z888 Allergy status to other drugs, medicaments and biological substances status: Secondary | ICD-10-CM | POA: Insufficient documentation

## 2018-05-09 DIAGNOSIS — E785 Hyperlipidemia, unspecified: Secondary | ICD-10-CM | POA: Insufficient documentation

## 2018-05-09 DIAGNOSIS — C50919 Malignant neoplasm of unspecified site of unspecified female breast: Secondary | ICD-10-CM | POA: Diagnosis present

## 2018-05-09 DIAGNOSIS — Z800 Family history of malignant neoplasm of digestive organs: Secondary | ICD-10-CM | POA: Insufficient documentation

## 2018-05-09 HISTORY — DX: Malignant neoplasm of unspecified part of unspecified bronchus or lung: C34.90

## 2018-05-09 LAB — TROPONIN I

## 2018-05-09 LAB — COMPREHENSIVE METABOLIC PANEL
ALBUMIN: 2.6 g/dL — AB (ref 3.5–5.0)
ALT: 12 U/L (ref 0–44)
AST: 32 U/L (ref 15–41)
Alkaline Phosphatase: 65 U/L (ref 38–126)
Anion gap: 12 (ref 5–15)
BUN: 99 mg/dL — AB (ref 8–23)
CHLORIDE: 95 mmol/L — AB (ref 98–111)
CO2: 23 mmol/L (ref 22–32)
Calcium: 8.3 mg/dL — ABNORMAL LOW (ref 8.9–10.3)
Creatinine, Ser: 3.88 mg/dL — ABNORMAL HIGH (ref 0.44–1.00)
GFR calc Af Amer: 13 mL/min — ABNORMAL LOW (ref >60)
GFR, EST NON AFRICAN AMERICAN: 11 mL/min — AB (ref >60)
Glucose, Bld: 366 mg/dL — ABNORMAL HIGH (ref 70–99)
POTASSIUM: 5.9 mmol/L — AB (ref 3.5–5.1)
Sodium: 130 mmol/L — ABNORMAL LOW (ref 135–145)
Total Bilirubin: 1.4 mg/dL — ABNORMAL HIGH (ref 0.3–1.2)
Total Protein: 6.2 g/dL — ABNORMAL LOW (ref 6.5–8.1)

## 2018-05-09 LAB — CBC WITH DIFFERENTIAL/PLATELET
BASOS PCT: 0 %
Basophils Absolute: 0 10*3/uL (ref 0.0–0.1)
EOS PCT: 1 %
Eosinophils Absolute: 0.1 10*3/uL (ref 0.0–0.7)
HCT: 33.7 % — ABNORMAL LOW (ref 36.0–46.0)
Hemoglobin: 10.8 g/dL — ABNORMAL LOW (ref 12.0–15.0)
Lymphocytes Relative: 12 %
Lymphs Abs: 2.3 10*3/uL (ref 0.7–4.0)
MCH: 28.1 pg (ref 26.0–34.0)
MCHC: 32 g/dL (ref 30.0–36.0)
MCV: 87.8 fL (ref 78.0–100.0)
MONO ABS: 1.8 10*3/uL — AB (ref 0.1–1.0)
Monocytes Relative: 10 %
Neutro Abs: 14.6 10*3/uL — ABNORMAL HIGH (ref 1.7–7.7)
Neutrophils Relative %: 77 %
PLATELETS: 339 10*3/uL (ref 150–400)
RBC: 3.84 MIL/uL — ABNORMAL LOW (ref 3.87–5.11)
RDW: 15.9 % — AB (ref 11.5–15.5)
WBC: 18.9 10*3/uL — ABNORMAL HIGH (ref 4.0–10.5)

## 2018-05-09 LAB — BRAIN NATRIURETIC PEPTIDE: B NATRIURETIC PEPTIDE 5: 42.7 pg/mL (ref 0.0–100.0)

## 2018-05-09 LAB — I-STAT CG4 LACTIC ACID, ED
LACTIC ACID, VENOUS: 2.67 mmol/L — AB (ref 0.5–1.9)
Lactic Acid, Venous: 2.43 mmol/L (ref 0.5–1.9)

## 2018-05-09 LAB — CBG MONITORING, ED: GLUCOSE-CAPILLARY: 361 mg/dL — AB (ref 70–99)

## 2018-05-09 MED ORDER — SODIUM CHLORIDE 0.9 % IV BOLUS
1000.0000 mL | Freq: Once | INTRAVENOUS | Status: AC
  Administered 2018-05-09: 1000 mL via INTRAVENOUS

## 2018-05-09 MED ORDER — HYDROCOD POLST-CPM POLST ER 10-8 MG/5ML PO SUER
5.0000 mL | Freq: Once | ORAL | Status: AC
  Administered 2018-05-09: 5 mL via ORAL
  Filled 2018-05-09: qty 5

## 2018-05-09 MED ORDER — MORPHINE SULFATE (PF) 4 MG/ML IV SOLN
4.0000 mg | Freq: Once | INTRAVENOUS | Status: AC
  Administered 2018-05-10: 4 mg via INTRAVENOUS
  Filled 2018-05-09: qty 1

## 2018-05-09 MED ORDER — ALBUTEROL SULFATE (2.5 MG/3ML) 0.083% IN NEBU
5.0000 mg | INHALATION_SOLUTION | Freq: Once | RESPIRATORY_TRACT | Status: AC
  Administered 2018-05-09: 5 mg via RESPIRATORY_TRACT
  Filled 2018-05-09: qty 6

## 2018-05-09 MED ORDER — SODIUM CHLORIDE 0.9 % IV SOLN
2.0000 g | Freq: Once | INTRAVENOUS | Status: AC
  Administered 2018-05-09: 2 g via INTRAVENOUS
  Filled 2018-05-09: qty 2

## 2018-05-09 MED ORDER — PIPERACILLIN-TAZOBACTAM 3.375 G IVPB 30 MIN: 3.3750 g | Freq: Once | INTRAVENOUS | Status: DC

## 2018-05-09 MED ORDER — SODIUM CHLORIDE 0.9 % IV BOLUS
1000.0000 mL | Freq: Once | INTRAVENOUS | Status: AC
  Administered 2018-05-10: 1000 mL via INTRAVENOUS

## 2018-05-09 MED ORDER — VANCOMYCIN HCL 10 G IV SOLR
2500.0000 mg | Freq: Once | INTRAVENOUS | Status: AC
  Administered 2018-05-09: 2500 mg via INTRAVENOUS
  Filled 2018-05-09: qty 2000

## 2018-05-09 MED ORDER — VANCOMYCIN HCL IN DEXTROSE 1-5 GM/200ML-% IV SOLN: 1000.0000 mg | Freq: Once | INTRAVENOUS | Status: DC

## 2018-05-09 NOTE — ED Notes (Signed)
ED Provider at bedside. 

## 2018-05-09 NOTE — ED Provider Notes (Signed)
[facility] DEPT Provider Note   CSN: [id number] Arrival date & time: 05/09/18  1923     History   Chief Complaint Chief Complaint  Patient presents with  . Hyperglycemia    HPI Carrie Robbins is a 67 y.o. female.  HPI   67 year old female with past medical history of metastatic breast cancer here with hyperglycemia and generalized weakness.  Patient states that over the last 2 to 3 weeks, she is had progressively worsening, severe cough.  The cough is nonproductive but is relentless.  She has tried over-the-counter meds without significant relief.  She also endorses some mild confusion and her blood sugars have been progressively escalating.  Denies any changes to her insulin or diet.  She feels generally fatigued and tired because she is not sleeping due to her cough.  She feels "rundown."  Denies any abdominal pain, nausea, or vomiting.  Denies any fevers or chills.  No hemoptysis.  Denies any increased swelling in her legs.  Past Medical History:  Diagnosis Date  . Anxiety   . Arthritis   . Breast cancer ([place]) 03/2017   left breast, [place]  . Colon cancer ([place])    about 10 years ago  . Depression   . Diabetes ([place])   . GERD (gastroesophageal reflux disease)   . Lung cancer [facility])     Patient Active Problem List   Diagnosis Date Noted  . Metastatic breast cancer ([place]) 04/03/2018  . Goals of care, counseling/discussion 04/03/2018  . Abdominal pain, epigastric 03/05/2018  . History of colon cancer 03/05/2018  . History of breast cancer 03/05/2018    Past Surgical History:  Procedure Laterality Date  . CATARACT EXTRACTION    . CATARACT EXTRACTION W/ INTRAOCULAR LENS IMPLANT    . CESAREAN SECTION     X 2  . CHOLECYSTECTOMY    . COLON SURGERY     10 years ago, colon cancer  . COLONOSCOPY  06/2014   [facility]: normal-appearing anastomosis at hepatic flexure, small internal hemorrhoids, otherwise normal, surveillance 2020  .  MASTECTOMY Left    with lymph node dissection     OB History   None      Home Medications    Prior to Admission medications   Medication Sig Start Date End Date Taking? Authorizing Provider  aspirin EC 81 MG tablet Take 81 mg by mouth daily.    [provider]  atorvastatin (LIPITOR) 20 MG tablet Take 1 tablet by mouth daily. 02/02/18   [provider]  busPIRone (BUSPAR) 10 MG tablet Take 10 mg by mouth 3 (three) times daily. 01/26/18   [provider]  clonazePAM (KLONOPIN) 0.5 MG tablet Take 3 tablets by mouth daily. 02/04/18   [provider]  FLUoxetine (PROZAC) 20 MG tablet 4 tablets daily 02/02/18   [provider]  fluticasone (FLONASE) 50 MCG/ACT nasal spray 2 sprays daily. 02/04/18   [provider]  glimepiride (AMARYL) 4 MG tablet Take 2 tablets by mouth daily. 02/12/18   [provider]  lisinopril-hydrochlorothiazide (PRINZIDE,ZESTORETIC) 20-25 MG tablet Take 1 tablet by mouth daily. 01/05/18   [provider]  omeprazole (PRILOSEC) 40 MG capsule Take 40 mg by mouth 2 (two) times daily before a meal.    [provider]  risperiDONE (RISPERDAL) 1 MG tablet Take 1 tablet by mouth daily. 02/12/18   [provider]  TRADJENTA 5 MG TABS tablet Take 1 tablet by mouth daily. 01/05/18   [provider]  traMADol (ULTRAM) 50 MG tablet Take 50 mg by mouth 4 (four) times daily.    [provider]  traZODone (DESYREL) 50 MG tablet Take 1.5 tablets by mouth daily. 02/12/18   [provider]  TRESIBA FLEXTOUCH 100 UNIT/ML SOPN FlexTouch Pen 35 Units daily. 02/02/18   [provider]    Family History Family History  Problem Relation Age of Onset  . Stomach cancer Mother   . Liver cancer Maternal Grandmother   . Colon cancer Neg Hx     Social History Social History   Tobacco Use  . Smoking status: Never Smoker  . Smokeless tobacco: Never Used  Substance Use Topics  .  Alcohol use: Never    Frequency: Never  . Drug use: Never     Allergies   Combivent respimat [ipratropium-albuterol]; Excedrin extra strength [asa-apap-caff buffered]; and Metformin and related   Review of Systems Review of Systems  Constitutional: Positive for fatigue. Negative for chills and fever.  HENT: Negative for congestion and rhinorrhea.   Eyes: Negative for visual disturbance.  Respiratory: Positive for cough and shortness of breath. Negative for wheezing.   Cardiovascular: Negative for chest pain and leg swelling.  Gastrointestinal: Negative for abdominal pain, diarrhea, nausea and vomiting.  Endocrine: Positive for polyuria.  Genitourinary: Negative for dysuria and flank pain.  Musculoskeletal: Negative for neck pain and neck stiffness.  Skin: Negative for rash and wound.  Allergic/Immunologic: Negative for immunocompromised state.  Neurological: Positive for weakness. Negative for syncope and headaches.  All other systems reviewed and are negative.    Physical Exam Updated Vital Signs BP 119/64   Pulse 84   Temp 99.4 F (37.4 C) (Rectal)   Resp 15   Ht 5\' 5"  (1.651 m)   Wt 117.9 kg (260 lb)   SpO2 98%   BMI 43.27 kg/m   Physical Exam  Constitutional: She is oriented to person, place, and time. She appears well-developed and well-nourished. She appears distressed (Frequent coughing).  HENT:  Head: Normocephalic and atraumatic.  Eyes: Conjunctivae are normal.  Neck: Neck supple.  Cardiovascular: Normal rate, regular rhythm and normal heart sounds. Exam reveals no friction rub.  No murmur heard. Pulmonary/Chest: Effort normal. Tachypnea noted. No respiratory distress. She has decreased breath sounds. She has no rales.  Abdominal: She exhibits no distension.  Musculoskeletal: She exhibits no edema.  Neurological: She is alert and oriented to person, place, and time. She exhibits normal muscle tone.  Skin: Skin is warm. Capillary refill takes less than 2  seconds.  Psychiatric: She has a normal mood and affect.  Nursing note and vitals reviewed.    ED Treatments / Results  Labs (all labs ordered are listed, but only abnormal results are displayed) Labs Reviewed  CBC WITH DIFFERENTIAL/PLATELET - Abnormal; Notable for the following components:      Result Value   WBC 18.9 (*)    RBC 3.84 (*)    Hemoglobin 10.8 (*)    HCT 33.7 (*)    RDW 15.9 (*)    Neutro Abs 14.6 (*)    Monocytes Absolute 1.8 (*)    All other components within normal limits  COMPREHENSIVE METABOLIC PANEL - Abnormal; Notable for the following components:   Sodium 130 (*)    Potassium 5.9 (*)    Chloride 95 (*)    Glucose, Bld 366 (*)    BUN 99 (*)    Creatinine, Ser 3.88 (*)    Calcium 8.3 (*)    Total Protein  6.2 (*)    Albumin 2.6 (*)    Total Bilirubin 1.4 (*)    GFR calc non Af Amer 11 (*)    GFR calc Af Amer 13 (*)    All other components within normal limits  CBG MONITORING, ED - Abnormal; Notable for the following components:   Glucose-Capillary 361 (*)    All other components within normal limits  I-STAT CG4 LACTIC ACID, ED - Abnormal; Notable for the following components:   Lactic Acid, Venous 2.67 (*)    All other components within normal limits  I-STAT CG4 LACTIC ACID, ED - Abnormal; Notable for the following components:   Lactic Acid, Venous 2.43 (*)    All other components within normal limits  CULTURE, BLOOD (ROUTINE X 2)  CULTURE, BLOOD (ROUTINE X 2)  TROPONIN I  BRAIN NATRIURETIC PEPTIDE  URINALYSIS, ROUTINE W REFLEX MICROSCOPIC    EKG None  Radiology Dg Chest 2 View  Result Date: 05/09/2018 CLINICAL DATA:  Bilateral lung, breast and colon cancer with cough. EXAM: CHEST - 2 VIEW COMPARISON:  Chest CT 03/31/2018 FINDINGS: Pulmonary masses are redemonstrated throughout both lungs, with interval increase in size since prior chest CT compatible with slight worsening of known pulmonary metastasis. Dominant mass in the right lung measures  3.6 cm versus 2.6 cm previously projecting over the right mid lung. A 2.8 cm mass in the left lung apex is noted previously measuring 2.4 cm. Additional smaller lesions are identified, some which are obscured by the cardiac silhouette. Heart size is top-normal. Nonaneurysmal thoracic aorta is redemonstrated with central vascular congestion. No acute pulmonary consolidation, effusion or pneumothorax. No aggressive osseous lesions. Degenerative changes present along the dorsal spine. IMPRESSION: Redemonstration of several pulmonary masses throughout both lungs with interval increase in size of some of the more dominant masses since prior exam, largest in the right mid lung measuring to 3.6 cm currently. Mild central vascular congestion is noted without acute pulmonary consolidations. Electronically Signed   By: Ashley Royalty M.D.   On: 05/09/2018 20:33   Ct Head Wo Contrast  Result Date: 05/09/2018 CLINICAL DATA:  Altered level of consciousness. Patient with listed history of breast cancer, colon cancer, and lung cancer. EXAM: CT HEAD WITHOUT CONTRAST TECHNIQUE: Contiguous axial images were obtained from the base of the skull through the vertex without intravenous contrast. COMPARISON:  None. FINDINGS: Despite 2 scan acquisitions there is significant patient motion artifact, examination is near nondiagnostic. Brain: No hydrocephalus. No evidence of large intracranial bleed or large territorial infarct. More detailed assessment is limited. Vascular: Limited assessment for hyperdense vessel due to motion. Skull: No gross focal lesion or fracture. Sinuses/Orbits: Grossly normal. Other: None. IMPRESSION: Significant patient motion artifact, despite repeat scan acquisition, examination is near nondiagnostic. No gross acute abnormality is seen. Consider follow-up exam when patient is able based on clinical concern. Electronically Signed   By: Jeb Levering M.D.   On: 05/09/2018 23:20   Ct Chest Wo Contrast  Result  Date: 05/09/2018 CLINICAL DATA:  Lung cancer.  Cough. EXAM: CT CHEST WITHOUT CONTRAST TECHNIQUE: Multidetector CT imaging of the chest was performed following the standard protocol without IV contrast. COMPARISON:  03/31/2018 FINDINGS: Cardiovascular: Heart size upper normal. No pericardial effusion. Atherosclerotic calcification is noted in the wall of the thoracic aorta. Mediastinum/Nodes: Similar appearance of mediastinal lymphadenopathy. Index precarinal lymph node measured at 17 mm previously is 19 mm. No evidence for gross hilar lymphadenopathy although assessment is limited by the lack of intravenous contrast on  today's study. The esophagus has normal imaging features. There is no axillary lymphadenopathy. Lungs/Pleura: Bilateral pulmonary nodules and masses are again noted. Left apical lesion measured previously at 2.4 cm is 2.6 cm today. 3.2 cm right lower lobe mass on today's exam was 2.3 cm when I remeasure it in a similar fashion on the prior study. 19 mm nodule in the posterior left costophrenic sulcus on today's study was 14 mm previously. No pulmonary edema or pleural effusion. Upper Abdomen: Unremarkable. Musculoskeletal: No worrisome lytic or sclerotic osseous abnormality. IMPRESSION: Interval progression of mediastinal lymphadenopathy and bilateral pulmonary nodules / masses. Electronically Signed   By: Misty Stanley M.D.   On: 05/09/2018 23:20    Procedures .Critical Care Performed by: Duffy Bruce, MD Authorized by: Duffy Bruce, MD   Critical care provider statement:    Critical care time (minutes):  35   Critical care time was exclusive of:  Separately billable procedures and treating other patients and teaching time   Critical care was necessary to treat or prevent imminent or life-threatening deterioration of the following conditions:  Cardiac failure, respiratory failure, sepsis and circulatory failure   Critical care was time spent personally by me on the following  activities:  Development of treatment plan with patient or surrogate, discussions with consultants, evaluation of patient's response to treatment, examination of patient, obtaining history from patient or surrogate, ordering and performing treatments and interventions, ordering and review of laboratory studies, ordering and review of radiographic studies, pulse oximetry, re-evaluation of patient's condition and review of old charts   I assumed direction of critical care for this patient from another provider in my specialty: no     (including critical care time)  Medications Ordered in ED Medications  vancomycin (VANCOCIN) 2,500 mg in sodium chloride 0.9 % 500 mL IVPB (2,500 mg Intravenous New Bag/Given 05/09/18 2316)  sodium chloride 0.9 % bolus 1,000 mL (has no administration in time range)  morphine 4 MG/ML injection 4 mg (has no administration in time range)  sodium chloride 0.9 % bolus 1,000 mL (1,000 mLs Intravenous New Bag/Given 05/09/18 2049)  chlorpheniramine-HYDROcodone (TUSSIONEX) 10-8 MG/5ML suspension 5 mL (5 mLs Oral Given 05/09/18 2045)  albuterol (PROVENTIL) (2.5 MG/3ML) 0.083% nebulizer solution 5 mg (5 mg Nebulization Given 05/09/18 2047)  sodium chloride 0.9 % bolus 1,000 mL (1,000 mLs Intravenous New Bag/Given 05/09/18 2219)  ceFEPIme (MAXIPIME) 2 g in sodium chloride 0.9 % 100 mL IVPB (2 g Intravenous New Bag/Given 05/09/18 2229)     Initial Impression / Assessment and Plan / ED Course  I have reviewed the triage vital signs and the nursing notes.  Pertinent labs & imaging results that were available during my care of the patient were reviewed by me and considered in my medical decision making (see chart for details).     67 year old female with history of metastatic cancer with metastases in the lungs, history of breast cancer, history of colon cancer, here with cough, generalized weakness, and hyperglycemia.  Patient has been on multiple outpatient antibiotic rounds for persistent  cough.  On arrival here, the patient appears ill and dehydrated.  Lab work shows leukocytosis, lactic acidosis, and significant AKI.  Patient activated as a code sepsis.  She does appear dehydrated clinically but given her significant weight, I am cautious about administering 30 cc/kg in the setting of possibly CHF given her edema clinically.  Will give cautious fluids, start on empiric antibiotics, and sent for CT scan as chest x-ray shows metastases, but no new  abnormality.  CT scan shows interval worsening of pulmonary metastases.  She remains tachypneic.  Lactic acid remains elevated.  Will admit for AKI and symptom control.  I believe patient would benefit from a palliative consult, and she is considering hospice per family.  Final Clinical Impressions(s) / ED Diagnoses   Final diagnoses:  SIRS (systemic inflammatory response syndrome) ([place])  AKI (acute kidney injury) [facility])    ED Discharge Orders    None       Duffy Bruce, MD 05/09/18 2335

## 2018-05-09 NOTE — Progress Notes (Signed)
A consult was received from an ED physician for cefepime and vancomycin per pharmacy dosing.  The patient's profile has been reviewed for ht/wt/allergies/indication/available labs.   A one time order has been placed for Cefepime 2 Gm and Vancomycin 2500 mg.  Further antibiotics/pharmacy consults should be ordered by admitting physician if indicated.                       Thank you, Dorrene German 05/09/2018  9:43 PM

## 2018-05-09 NOTE — ED Notes (Signed)
Bed: YI01 Expected date:  Expected time:  Means of arrival:  Comments: EMS-Ca pt

## 2018-05-09 NOTE — H&P (Signed)
History and Physical    Carrie Robbins ZOX:[id number] DOB: [birth year] DOA: 05/09/2018  Referring MD/NP/PA: Duffy Bruce, MD PCP: Celene Squibb, MD  Patient coming from: Home via EMS  Chief Complaint: Weakness and cough  I have personally briefly reviewed patient's old medical records in [place]   HPI: Carrie Robbins is a 67 y.o. female with medical history significant of triple negative breast cancer s/p resection in 2018, colon cancer s/p resection in February 2009, diabetes mellitus type 2, depression, and schizophrenia; who presents with 4 weeks of progressively worsening nonproductive cough.  Patient lives with daughter and family family helping to care for her at since 2018. Multiple members in the household had colds that the patient had exposure to.  Patient had been treated by her primary care provider with several rounds of antibiotics including azithromycin, amoxicillin, Diflucan, and most recently started on doxycycline yesterday for treatment of a possible bacterial source as cause of symptoms.  Family note that she is also tried multiple different cough medicines including onces with codeine without much relief of symptoms.  She was even tried on a steroid Dosepak which she completed 3 to 4 days ago and had been doing better initially.  However, has rapidly declined over the last several days.  Patient reportedly lost 6 pounds in the last few days related to decreased appetite.  Associated symptoms include progressively worsening lower extremity swelling, elevated blood sugars as high as 577 at home, generalized weakness unable to ambulate on her own as she previously could, decreased urine output in last 48 hours, foul-smelling urine, intermittent wheezing, and pain secondary to coughing.  Patient having to sit up due to worsening cough with laying down.   Denies any significant fever, chills, hemoptysis, nausea, or vomiting.  Her husband died earlier this year and this time  she just wants to be comfortable.  She was told that she was not a candidate for any additional chemotherapy after being seen by oncology back in June and any pain hospital.  Patient reports at this time wanting to be comfortable and not having any aggressive therapies.  CODE STATUS was discussed and patient reports that she does not want any resuscitative efforts if she is to pass away. Patient would ultimately like to go home.  ED Course: Upon admission into the emergency department patient was seen to be afebrile, pulse 84-86, respiration 15-22, blood pressure 104/40 -144/81, and O2 saturation maintained on room air.  Labs revealed WBC 18.9, hemoglobin 10.8, sodium 130, potassium 5.9, chloride 95, BUN 99, creatinine 3.88, glucose 366, lactic acid 2.67. CT scan showed interval progression of mediastinal lymphadenopathy with bilateral pulmonary masses.  Urinalysis had not been obtained yet.  Sepsis protocol was initiated with full fluid bolus and empiric antibiotics of vancomycin and cefepime.  Review of Systems  Unable to perform ROS: Critical illness  Constitutional: Positive for malaise/fatigue and weight loss. Negative for diaphoresis and fever.  HENT: Positive for sore throat.   Eyes: Negative for pain and discharge.  Respiratory: Positive for cough. Negative for sputum production.   Cardiovascular: Positive for leg swelling.  Gastrointestinal: Negative for blood in stool, nausea and vomiting.  Genitourinary:       Positive for decreased urine output  Musculoskeletal: Positive for joint pain and myalgias.  Skin:       Positive for skin tear  Neurological: Positive for weakness.  Psychiatric/Behavioral: Negative for substance abuse.     Past Medical History:  Diagnosis Date  . Anxiety   .  Arthritis   . Breast cancer ([place]) 03/2017   left breast, [place]  . Colon cancer ([place])    about 10 years ago  . Depression   . Diabetes ([place])   . GERD (gastroesophageal reflux disease)   .  Lung cancer [facility])     Past Surgical History:  Procedure Laterality Date  . CATARACT EXTRACTION    . CATARACT EXTRACTION W/ INTRAOCULAR LENS IMPLANT    . CESAREAN SECTION     X 2  . CHOLECYSTECTOMY    . COLON SURGERY     10 years ago, colon cancer  . COLONOSCOPY  06/2014   [place]: normal-appearing anastomosis at hepatic flexure, small internal hemorrhoids, otherwise normal, surveillance 2020  . MASTECTOMY Left    with lymph node dissection     reports that she has never smoked. She has never used smokeless tobacco. She reports that she does not drink alcohol or use drugs.  Allergies  Allergen Reactions  . Combivent Respimat [Ipratropium-Albuterol]     "can't breathe"  . Excedrin Extra Strength [Asa-Apap-Caff Buffered]     nervous  . Metformin And Related     Mood swings, diarrhea    Family History  Problem Relation Age of Onset  . Stomach cancer Mother   . Liver cancer Maternal Grandmother   . Colon cancer Neg Hx     Prior to Admission medications   Medication Sig Start Date End Date Taking? Authorizing Provider  aspirin EC 81 MG tablet Take 81 mg by mouth daily.   Yes [provider]  atorvastatin (LIPITOR) 20 MG tablet Take 1 tablet by mouth daily. 02/02/18  Yes [provider]  busPIRone (BUSPAR) 7.5 MG tablet Take 10 mg by mouth 3 (three) times daily.  01/26/18  Yes [provider]  clonazePAM (KLONOPIN) 0.5 MG tablet Take 3 tablets by mouth daily. 02/04/18  Yes [provider]  FLUoxetine (PROZAC) 20 MG tablet Take 20 mg by mouth 4 (four) times daily. 4 tablets daily 02/02/18  Yes [provider]  fluticasone (FLONASE) 50 MCG/ACT nasal spray 2 sprays daily. 02/04/18  Yes [provider]  glimepiride (AMARYL) 4 MG tablet Take 2 tablets by mouth daily. 02/12/18  Yes [provider]  lisinopril-hydrochlorothiazide (PRINZIDE,ZESTORETIC) 20-25 MG tablet Take 1 tablet by mouth daily. 01/05/18  Yes [provider]  omeprazole (PRILOSEC) 40 MG capsule Take 40 mg by mouth 2 (two) times daily before a meal.   Yes [provider]  risperiDONE (RISPERDAL) 1 MG tablet Take 1 tablet by mouth daily. 02/12/18  Yes [provider]  TRADJENTA 5 MG TABS tablet Take 5 mg by mouth daily.  01/05/18  Yes [provider]  traMADol (ULTRAM) 50 MG tablet Take 50 mg by mouth 4 (four) times daily.   Yes [provider]  traZODone (DESYREL) 50 MG tablet Take 1.5 tablets by mouth daily. 02/12/18  Yes [provider]  TRESIBA FLEXTOUCH 100 UNIT/ML SOPN FlexTouch Pen Inject 35 Units into the skin daily.  02/02/18  Yes [provider]    Physical Exam:  Constitutional: Chronically ill-appearing female who appears significantly ill Vitals:   05/09/18 2044 05/09/18 2100 05/09/18 2228 05/09/18 2314  BP: (!) 104/40 119/64 (!) 123/49 (!) 112/53  Pulse: 86 84 84 86  Resp: 19 15 19 16   Temp:      TempSrc:      SpO2: 93% 98% 94% 97%  Weight:      Height:  Eyes: PERRL, lids and conjunctivae normal ENMT: Mucous membranes are moist. Posterior pharynx clear of any exudate or lesions.  Neck: normal, supple, no masses, no thyromegaly Respiratory: Tachypneic with coarse breath sounds patient coughing multiple times during exam. Cardiovascular: Regular rate and rhythm, no murmurs / rubs / gallops.  +3 pitting edema bilateral lower extremity edema. 2+ pedal pulses. No carotid bruits.  Abdomen: , no masses palpated. No hepatosplenomegaly. Bowel sounds positive.  Musculoskeletal: no clubbing / cyanosis. No joint deformity upper and lower extremities. Good ROM, no contractures. Normal muscle tone.  Skin: Pallor with skin tear of the anterior right shin. Neurologic: CN 2-12 grossly intact. Sensation intact, DTR normal. Strength 4/5 in all 4.  Psychiatric: Normal judgment and insight. Alert and oriented x 3. Normal mood.     Labs on Admission: I have personally reviewed following  labs and imaging studies  CBC: Recent Labs  Lab 05/09/18 2008  WBC 18.9*  NEUTROABS 14.6*  HGB 10.8*  HCT 33.7*  MCV 87.8  PLT 443   Basic Metabolic Panel: Recent Labs  Lab 05/09/18 2008  NA 130*  K 5.9*  CL 95*  CO2 23  GLUCOSE 366*  BUN 99*  CREATININE 3.88*  CALCIUM 8.3*   GFR: Estimated Creatinine Clearance: 18.1 mL/min (A) (by C-G formula based on SCr of 3.88 mg/dL (H)). Liver Function Tests: Recent Labs  Lab 05/09/18 2008  AST 32  ALT 12  ALKPHOS 65  BILITOT 1.4*  PROT 6.2*  ALBUMIN 2.6*   No results for input(s): LIPASE, AMYLASE in the last 168 hours. No results for input(s): AMMONIA in the last 168 hours. Coagulation Profile: No results for input(s): INR, PROTIME in the last 168 hours. Cardiac Enzymes: Recent Labs  Lab 05/09/18 2008  TROPONINI <0.03   BNP (last 3 results) No results for input(s): PROBNP in the last 8760 hours. HbA1C: No results for input(s): HGBA1C in the last 72 hours. CBG: Recent Labs  Lab 05/09/18 1950  GLUCAP 361*   Lipid Profile: No results for input(s): CHOL, HDL, LDLCALC, TRIG, CHOLHDL, LDLDIRECT in the last 72 hours. Thyroid Function Tests: No results for input(s): TSH, T4TOTAL, FREET4, T3FREE, THYROIDAB in the last 72 hours. Anemia Panel: No results for input(s): VITAMINB12, FOLATE, FERRITIN, TIBC, IRON, RETICCTPCT in the last 72 hours. Urine analysis: No results found for: COLORURINE, APPEARANCEUR, LABSPEC, PHURINE, GLUCOSEU, HGBUR, BILIRUBINUR, KETONESUR, PROTEINUR, UROBILINOGEN, NITRITE, LEUKOCYTESUR Sepsis Labs: No results found for this or any previous visit (from the past 240 hour(s)).   Radiological Exams on Admission: Dg Chest 2 View  Result Date: 05/09/2018 CLINICAL DATA:  Bilateral lung, breast and colon cancer with cough. EXAM: CHEST - 2 VIEW COMPARISON:  Chest CT 03/31/2018 FINDINGS: Pulmonary masses are redemonstrated throughout both lungs, with interval increase in size since prior chest CT  compatible with slight worsening of known pulmonary metastasis. Dominant mass in the right lung measures 3.6 cm versus 2.6 cm previously projecting over the right mid lung. A 2.8 cm mass in the left lung apex is noted previously measuring 2.4 cm. Additional smaller lesions are identified, some which are obscured by the cardiac silhouette. Heart size is top-normal. Nonaneurysmal thoracic aorta is redemonstrated with central vascular congestion. No acute pulmonary consolidation, effusion or pneumothorax. No aggressive osseous lesions. Degenerative changes present along the dorsal spine. IMPRESSION: Redemonstration of several pulmonary masses throughout both lungs with interval increase in size of some of the more dominant masses since prior exam, largest in the right mid lung measuring to 3.6  cm currently. Mild central vascular congestion is noted without acute pulmonary consolidations. Electronically Signed   By: Ashley Royalty M.D.   On: 05/09/2018 20:33   Ct Head Wo Contrast  Result Date: 05/09/2018 CLINICAL DATA:  Altered level of consciousness. Patient with listed history of breast cancer, colon cancer, and lung cancer. EXAM: CT HEAD WITHOUT CONTRAST TECHNIQUE: Contiguous axial images were obtained from the base of the skull through the vertex without intravenous contrast. COMPARISON:  None. FINDINGS: Despite 2 scan acquisitions there is significant patient motion artifact, examination is near nondiagnostic. Brain: No hydrocephalus. No evidence of large intracranial bleed or large territorial infarct. More detailed assessment is limited. Vascular: Limited assessment for hyperdense vessel due to motion. Skull: No gross focal lesion or fracture. Sinuses/Orbits: Grossly normal. Other: None. IMPRESSION: Significant patient motion artifact, despite repeat scan acquisition, examination is near nondiagnostic. No gross acute abnormality is seen. Consider follow-up exam when patient is able based on clinical concern.  Electronically Signed   By: Jeb Levering M.D.   On: 05/09/2018 23:20   Ct Chest Wo Contrast  Result Date: 05/09/2018 CLINICAL DATA:  Lung cancer.  Cough. EXAM: CT CHEST WITHOUT CONTRAST TECHNIQUE: Multidetector CT imaging of the chest was performed following the standard protocol without IV contrast. COMPARISON:  03/31/2018 FINDINGS: Cardiovascular: Heart size upper normal. No pericardial effusion. Atherosclerotic calcification is noted in the wall of the thoracic aorta. Mediastinum/Nodes: Similar appearance of mediastinal lymphadenopathy. Index precarinal lymph node measured at 17 mm previously is 19 mm. No evidence for gross hilar lymphadenopathy although assessment is limited by the lack of intravenous contrast on today's study. The esophagus has normal imaging features. There is no axillary lymphadenopathy. Lungs/Pleura: Bilateral pulmonary nodules and masses are again noted. Left apical lesion measured previously at 2.4 cm is 2.6 cm today. 3.2 cm right lower lobe mass on today's exam was 2.3 cm when I remeasure it in a similar fashion on the prior study. 19 mm nodule in the posterior left costophrenic sulcus on today's study was 14 mm previously. No pulmonary edema or pleural effusion. Upper Abdomen: Unremarkable. Musculoskeletal: No worrisome lytic or sclerotic osseous abnormality. IMPRESSION: Interval progression of mediastinal lymphadenopathy and bilateral pulmonary nodules / masses. Electronically Signed   By: Misty Stanley M.D.   On: 05/09/2018 23:20    EKG: Independently reviewed.  Sinus rhythm at 92 bpm with signs of early T wave peaking  Assessment/Plan SIRS: Acute.  Patient presents with WBC 18.9 and lactic acid 2.67 on admission.  No clear signs of pneumonia noted on CT scan of the chest.  Urinalysis still pending.  Sepsis protocol was initiated with 3 L of normal saline IV fluids antibiotics of vancomycin and cefepime. - Admit to stepdown   - Follow-up blood and urine cultures -  Continue empiric antibiotics of vancomycin and cefepime - Add on procalcitonin - Trend lactic acid level  - Recheck CBC in a.m.  Acute renal failure: Baseline creatinine previously noted to be around 0.9 on 6/25, but patient presents with a creatinine of 3.88 with BUN 99.  Suspect possible prerenal cause of symptoms. - Follow-up urinalysis - Check renal ultrasound  - Check CPK - Strict intake and output - Continue IV fluids as tolerated - Hold nephrotoxic agents  Diabetes mellitus type 2 with hyperglycemia: Acute.  Patient presented with initial blood glucose 366 without elevated anion gap.  Suspect acute elevation in glucose may be related with recent steroids or underlying infection. - Hypoglycemic protocols - Hold Amaryl and Tradjenta -  Continue Tyler Aas  - CBGs every 4 hours with moderate SSI   Metastatic cancer, history of breast and colon cancer: Patient had left mastectomy and lymph node dissection on 03/17/2017 in [place], but declined any adjunctive chemotherapy at that time.  Previous history of colon cancer back in 2009 status post resection.  Patient was just recently evaluated at [facility] by Dr.Katragadda on 6/28 and noted to have metastatic disease thought possibly secondary to breast cancer. Patient was not a candidate for any adjunctive chemotherapy due to poor mental and functional status.  Patient was scheduled to have EGD by Dr. Gala Romney on 8/12.   - Continuous pulse oximetry overnight with nasal cannula oxygen as needed - Tessalon pearles and Tussionex as needed for cough - Albuterol nebs as needed - Ordered palliative care consult for in a.m.  Hyperkalemia: Acute initial potassium 5.9 on admission.  Some subtle signs of early T wave peaking noted. - Continue with IV fluids and insulin - Hold potassium supplementation  Acute encephalopathy: Patient presents with confusion.  CT scan of the brain showed no acute abnormalities.  Suspect likely multifactorial in the setting  of what appears to be severe dehydration with elevated uremia. - neuro checks  Normocytic normochromic anemia: Hemoglobin 10.8 on admission with elevated MCV suggest iron deficiency.  Question of possibility of acute blood loss with elevated BUN as well - Recheck CBC in a.m  Peripheral edema: Acute.  Patient with bilateral +3 pitting lower extremity edema.  Suspect secondary to the patient's poor nutritional status with albumin noted to be 2.6. - Glucerna shakes with meals  Essential hypertension - Hold furosemide and lisinopril-hydrochlorothiazide due to acute kidney injury  Mood disorder - Continue Prozac, BuSpar, and Risperdal   Hyperlipidemia - Continue atorvastatin  GERD - Continue pharmacy substitution of Protonix   DVT prophylaxis: SCDs Code Status: DNR Family Communication: Discussed plan of care with patient and family present at bedside Disposition Plan: TBD  Consults called: none  Admission status:Inpatient   Norval Morton MD [facility] Pager [phone]   If 7PM-7AM, please contact night-coverage [email or web address] Password [email or web address]  05/09/2018, 11:56 PM

## 2018-05-09 NOTE — ED Triage Notes (Signed)
Pt arrived by EMS from home. EMS reports that Pt has lung cancer but is not currently being treated for. EMS reports Pt's initial blood glucose was 504. Pt has some confusion and has a cough.

## 2018-05-10 ENCOUNTER — Inpatient Hospital Stay (HOSPITAL_COMMUNITY): Payer: Medicare Other

## 2018-05-10 ENCOUNTER — Other Ambulatory Visit: Payer: Self-pay

## 2018-05-10 DIAGNOSIS — N179 Acute kidney failure, unspecified: Secondary | ICD-10-CM | POA: Diagnosis not present

## 2018-05-10 DIAGNOSIS — D649 Anemia, unspecified: Secondary | ICD-10-CM | POA: Diagnosis present

## 2018-05-10 DIAGNOSIS — C7802 Secondary malignant neoplasm of left lung: Secondary | ICD-10-CM | POA: Diagnosis not present

## 2018-05-10 DIAGNOSIS — R6510 Systemic inflammatory response syndrome (SIRS) of non-infectious origin without acute organ dysfunction: Secondary | ICD-10-CM | POA: Diagnosis present

## 2018-05-10 DIAGNOSIS — C50919 Malignant neoplasm of unspecified site of unspecified female breast: Secondary | ICD-10-CM | POA: Diagnosis not present

## 2018-05-10 DIAGNOSIS — Z7189 Other specified counseling: Secondary | ICD-10-CM

## 2018-05-10 DIAGNOSIS — Z515 Encounter for palliative care: Secondary | ICD-10-CM

## 2018-05-10 DIAGNOSIS — E1165 Type 2 diabetes mellitus with hyperglycemia: Secondary | ICD-10-CM | POA: Diagnosis present

## 2018-05-10 DIAGNOSIS — E875 Hyperkalemia: Secondary | ICD-10-CM | POA: Diagnosis present

## 2018-05-10 LAB — CBC
HEMATOCRIT: 32.2 % — AB (ref 36.0–46.0)
Hemoglobin: 10 g/dL — ABNORMAL LOW (ref 12.0–15.0)
MCH: 27.7 pg (ref 26.0–34.0)
MCHC: 31.1 g/dL (ref 30.0–36.0)
MCV: 89.2 fL (ref 78.0–100.0)
Platelets: 284 10*3/uL (ref 150–400)
RBC: 3.61 MIL/uL — AB (ref 3.87–5.11)
RDW: 15.7 % — ABNORMAL HIGH (ref 11.5–15.5)
WBC: 17.9 10*3/uL — AB (ref 4.0–10.5)

## 2018-05-10 LAB — URINALYSIS, ROUTINE W REFLEX MICROSCOPIC
Bilirubin Urine: NEGATIVE
GLUCOSE, UA: NEGATIVE mg/dL
Ketones, ur: NEGATIVE mg/dL
Nitrite: NEGATIVE
Protein, ur: NEGATIVE mg/dL
SPECIFIC GRAVITY, URINE: 1.014 (ref 1.005–1.030)
pH: 5 (ref 5.0–8.0)

## 2018-05-10 LAB — APTT: APTT: 38 s — AB (ref 24–36)

## 2018-05-10 LAB — BASIC METABOLIC PANEL
ANION GAP: 9 (ref 5–15)
BUN: 88 mg/dL — ABNORMAL HIGH (ref 8–23)
CO2: 23 mmol/L (ref 22–32)
Calcium: 7.7 mg/dL — ABNORMAL LOW (ref 8.9–10.3)
Chloride: 103 mmol/L (ref 98–111)
Creatinine, Ser: 2.89 mg/dL — ABNORMAL HIGH (ref 0.44–1.00)
GFR calc Af Amer: 18 mL/min — ABNORMAL LOW (ref >60)
GFR, EST NON AFRICAN AMERICAN: 16 mL/min — AB (ref >60)
Glucose, Bld: 104 mg/dL — ABNORMAL HIGH (ref 70–99)
POTASSIUM: 4.7 mmol/L (ref 3.5–5.1)
Sodium: 135 mmol/L (ref 135–145)

## 2018-05-10 LAB — HIV ANTIBODY (ROUTINE TESTING W REFLEX): HIV SCREEN 4TH GENERATION: NONREACTIVE

## 2018-05-10 LAB — GLUCOSE, CAPILLARY
GLUCOSE-CAPILLARY: 156 mg/dL — AB (ref 70–99)
Glucose-Capillary: 176 mg/dL — ABNORMAL HIGH (ref 70–99)
Glucose-Capillary: 110 mg/dL — ABNORMAL HIGH (ref 70–99)
Glucose-Capillary: 98 mg/dL (ref 70–99)

## 2018-05-10 LAB — PROTIME-INR
INR: 1.23
Prothrombin Time: 15.4 seconds — ABNORMAL HIGH (ref 11.4–15.2)

## 2018-05-10 LAB — LACTIC ACID, PLASMA: Lactic Acid, Venous: 1.1 mmol/L (ref 0.5–1.9)

## 2018-05-10 LAB — CK: Total CK: 827 U/L — ABNORMAL HIGH (ref 38–234)

## 2018-05-10 LAB — PROCALCITONIN: Procalcitonin: 0.1 ng/mL

## 2018-05-10 MED ORDER — ATORVASTATIN CALCIUM 20 MG PO TABS: 20.0000 mg | ORAL_TABLET | Freq: Every day | ORAL | Status: DC

## 2018-05-10 MED ORDER — VANCOMYCIN VARIABLE DOSE PER UNSTABLE RENAL FUNCTION (PHARMACIST DOSING): Status: DC

## 2018-05-10 MED ORDER — GLUCERNA SHAKE PO LIQD
237.0000 mL | Freq: Three times a day (TID) | ORAL | Status: DC
  Filled 2018-05-10 (×3): qty 237

## 2018-05-10 MED ORDER — FLUCONAZOLE 150 MG PO TABS
150.0000 mg | ORAL_TABLET | Freq: Every day | ORAL | Status: DC
  Administered 2018-05-10: 150 mg via ORAL
  Filled 2018-05-10: qty 1

## 2018-05-10 MED ORDER — IPRATROPIUM-ALBUTEROL 0.5-2.5 (3) MG/3ML IN SOLN
3.0000 mL | Freq: Four times a day (QID) | RESPIRATORY_TRACT | Status: DC
  Administered 2018-05-10: 3 mL via RESPIRATORY_TRACT
  Filled 2018-05-10: qty 3

## 2018-05-10 MED ORDER — INSULIN GLARGINE 100 UNIT/ML ~~LOC~~ SOLN
35.0000 [IU] | Freq: Every day | SUBCUTANEOUS | Status: DC
  Administered 2018-05-10: 35 [IU] via SUBCUTANEOUS
  Filled 2018-05-10: qty 0.35

## 2018-05-10 MED ORDER — ASPIRIN EC 81 MG PO TBEC
81.0000 mg | DELAYED_RELEASE_TABLET | Freq: Every day | ORAL | Status: DC
  Administered 2018-05-10: 81 mg via ORAL
  Filled 2018-05-10: qty 1

## 2018-05-10 MED ORDER — IPRATROPIUM-ALBUTEROL 0.5-2.5 (3) MG/3ML IN SOLN
3.0000 mL | RESPIRATORY_TRACT | Status: DC | PRN
  Administered 2018-05-10 (×2): 3 mL via RESPIRATORY_TRACT
  Filled 2018-05-10: qty 3

## 2018-05-10 MED ORDER — INSULIN ASPART 100 UNIT/ML ~~LOC~~ SOLN
0.0000 [IU] | SUBCUTANEOUS | Status: DC
  Administered 2018-05-10 (×2): 3 [IU] via SUBCUTANEOUS

## 2018-05-10 MED ORDER — SODIUM CHLORIDE 0.9 % IV SOLN
INTRAVENOUS | Status: DC
  Administered 2018-05-10: 02:00:00 via INTRAVENOUS

## 2018-05-10 MED ORDER — HYDROCOD POLST-CPM POLST ER 10-8 MG/5ML PO SUER
5.0000 mL | Freq: Two times a day (BID) | ORAL | Status: DC | PRN
  Administered 2018-05-10: 5 mL via ORAL
  Filled 2018-05-10: qty 5

## 2018-05-10 MED ORDER — CLONAZEPAM 0.5 MG PO TABS: 0.5000 mg | ORAL_TABLET | Freq: Every day | ORAL | Status: DC | PRN

## 2018-05-10 MED ORDER — RISPERIDONE 1 MG PO TABS
1.0000 mg | ORAL_TABLET | Freq: Every day | ORAL | Status: DC
  Filled 2018-05-10: qty 1

## 2018-05-10 MED ORDER — ENSURE ENLIVE PO LIQD: 237.0000 mL | Freq: Two times a day (BID) | ORAL | Status: DC

## 2018-05-10 MED ORDER — PROMETHAZINE HCL 25 MG PO TABS: 12.5000 mg | ORAL_TABLET | Freq: Four times a day (QID) | ORAL | Status: DC | PRN

## 2018-05-10 MED ORDER — FLUOXETINE HCL 20 MG PO CAPS
80.0000 mg | ORAL_CAPSULE | Freq: Every day | ORAL | Status: DC
  Administered 2018-05-10: 80 mg via ORAL
  Filled 2018-05-10: qty 4

## 2018-05-10 MED ORDER — MORPHINE SULFATE ER 15 MG PO TBCR
15.0000 mg | EXTENDED_RELEASE_TABLET | Freq: Two times a day (BID) | ORAL | Status: DC
Start: 2018-05-10 — End: 2018-05-10
  Administered 2018-05-10: 15 mg via ORAL
  Filled 2018-05-10: qty 1

## 2018-05-10 MED ORDER — GLYCOPYRROLATE 0.2 MG/ML IJ SOLN
0.2000 mg | Freq: Two times a day (BID) | INTRAMUSCULAR | Status: DC
  Administered 2018-05-10: 0.2 mg via INTRAVENOUS
  Filled 2018-05-10 (×2): qty 1

## 2018-05-10 MED ORDER — ALBUTEROL SULFATE (2.5 MG/3ML) 0.083% IN NEBU: 2.5000 mg | INHALATION_SOLUTION | RESPIRATORY_TRACT | Status: DC | PRN

## 2018-05-10 MED ORDER — MORPHINE SULFATE (PF) 4 MG/ML IV SOLN: 4.0000 mg | INTRAVENOUS | Status: DC | PRN

## 2018-05-10 MED ORDER — HYDROCOD POLST-CPM POLST ER 10-8 MG/5ML PO SUER: 5.0000 mL | Freq: Two times a day (BID) | ORAL | Status: DC

## 2018-05-10 MED ORDER — MORPHINE SULFATE (PF) 2 MG/ML IV SOLN
2.0000 mg | INTRAVENOUS | Status: DC | PRN
  Administered 2018-05-10: 2 mg via INTRAVENOUS
  Filled 2018-05-10: qty 1

## 2018-05-10 MED ORDER — LORAZEPAM 2 MG/ML IJ SOLN: 0.5000 mg | Freq: Four times a day (QID) | INTRAMUSCULAR | Status: DC | PRN

## 2018-05-10 MED ORDER — FLUTICASONE PROPIONATE 50 MCG/ACT NA SUSP
2.0000 | Freq: Every day | NASAL | Status: DC
  Filled 2018-05-10: qty 16

## 2018-05-10 MED ORDER — ALBUTEROL SULFATE (2.5 MG/3ML) 0.083% IN NEBU
2.5000 mg | INHALATION_SOLUTION | Freq: Four times a day (QID) | RESPIRATORY_TRACT | Status: DC
  Filled 2018-05-10: qty 3

## 2018-05-10 MED ORDER — VANCOMYCIN HCL 10 G IV SOLR: 1250.0000 mg | INTRAVENOUS | Status: DC

## 2018-05-10 MED ORDER — SODIUM CHLORIDE 0.9 % IV SOLN
1.0000 g | INTRAVENOUS | Status: DC
  Filled 2018-05-10: qty 1

## 2018-05-10 MED ORDER — BENZONATATE 100 MG PO CAPS
200.0000 mg | ORAL_CAPSULE | Freq: Three times a day (TID) | ORAL | Status: DC
  Administered 2018-05-10 (×3): 200 mg via ORAL
  Filled 2018-05-10 (×3): qty 2

## 2018-05-10 MED ORDER — PANTOPRAZOLE SODIUM 40 MG PO TBEC
40.0000 mg | DELAYED_RELEASE_TABLET | Freq: Two times a day (BID) | ORAL | Status: DC
  Administered 2018-05-10 (×2): 40 mg via ORAL
  Filled 2018-05-10 (×2): qty 1

## 2018-05-10 MED ORDER — BUSPIRONE HCL 5 MG PO TABS
7.5000 mg | ORAL_TABLET | Freq: Three times a day (TID) | ORAL | Status: DC
  Administered 2018-05-10 (×2): 7.5 mg via ORAL
  Filled 2018-05-10 (×2): qty 2

## 2018-05-10 MED ORDER — SODIUM CHLORIDE 0.9% FLUSH: 3.0000 mL | Freq: Two times a day (BID) | INTRAVENOUS | Status: DC

## 2018-05-10 NOTE — Care Management Obs Status (Signed)
[place] NOTIFICATION   Patient Details  Name: Carrie Robbins MRN: [id number] Date of Birth: 1951-10-03   Medicare Observation Status Notification Given:  Yes    Erenest Rasher, RN 05/10/2018, 3:30 PM

## 2018-05-10 NOTE — Progress Notes (Signed)
Pharmacy Antibiotic Note  Carrie Robbins is a 67 y.o. female admitted on 05/09/2018 with sepsis.  Pharmacy has been consulted for Vancomycin & Cefepime dosing.  05/10/2018:  Afebrile  Elevated WBC 17.9 (on prednisone as outpatient)  LA normalized, PCT 0.1 (strongly encourage discontinuation of abx per sepsis protocol)  AKI- Scr improved 2.89, est CrCl ~75ml/min  Plan: Cefepime 1gm IV q24h Vancomycin 1250mg  IV Q48h (goal AUC 400-500) Daily Scr F/U cx data & patient progress Consider check MRSA PCR if plan to continue abx & d/c Vancomycin if negative.   Height: 5\' 5"  (165.1 cm) Weight: 267 lb 6.7 oz (121.3 kg) IBW/kg (Calculated) : 57  Temp (24hrs), Avg:98.4 F (36.9 C), Min:97.8 F (36.6 C), Max:99.4 F (37.4 C)  Recent Labs  Lab 05/09/18 2008 05/09/18 2108 05/09/18 2250 05/10/18 0505  WBC 18.9*  --   --  17.9*  CREATININE 3.88*  --   --  2.89*  LATICACIDVEN  --  2.67* 2.43* 1.1    Estimated Creatinine Clearance: 24.7 mL/min (A) (by C-G formula based on SCr of 2.89 mg/dL (H)).    Allergies  Allergen Reactions  . Combivent Respimat [Ipratropium-Albuterol]     "can't breathe"  . Excedrin Extra Strength [Asa-Apap-Caff Buffered]     nervous  . Metformin And Related     Mood swings, diarrhea    Antimicrobials this admission: 8/3 Vanc >>  8/3 Cefepime >>  Diflucan >> (PTA med)  Dose adjustments this admission:  Microbiology results: 8/3 BCx:  MRSA PCR:   Thank you for allowing pharmacy to be a part of this patient's care.  Biagio Borg 05/10/2018 8:15 AM

## 2018-05-10 NOTE — Progress Notes (Signed)
PT Note  Patient Details Name: Carrie Robbins MRN: [id number] DOB: [birth year]  Chart reviewed, pt having palliative consult today. Will hold assessment at this time as we hear more about pt's wishes, goals and plan. Will check on patient tomorrow if necessary.   Thank you,   Clide Dales 05/10/2018, 11:07 AM  Clide Dales, PT Pager: [phone] 05/10/2018

## 2018-05-10 NOTE — Progress Notes (Signed)
[facility] RN  Visit  2:30 pm  Received request from [facility]  Servando Snare) for family interested in [place] with request for transfer today. Chart reviewed and eligibility confirmed. Met with pt's daughter Kristain Filo) and family to confirm interest and explain services. Family agreeable to transfer today. CSW aware. Registration paper work completed at bedside. Dr. Orpah Melter to assume care per family request. Please fax discharge summary to [phone]. RN please call report to [phone]. CSW pllease arrange transport for patient.  Thank you.  Raina Mina, RN, BSN [facility] Liaison [place] Liaison are on AMION

## 2018-05-10 NOTE — ED Notes (Signed)
Ultrasound at bedside

## 2018-05-10 NOTE — Progress Notes (Signed)
Patient to transport to [facility].   LCSW faxed dc paper work to facility.   Patient will transport by [facility].   RN report #: [place], Shawna Clamp [place] [phone]

## 2018-05-10 NOTE — Progress Notes (Signed)
Report given to Laurina Bustle, RN at [facility] place. Pt will be transported via [facility].

## 2018-05-10 NOTE — ED Notes (Signed)
Admit Provider at bedside. 

## 2018-05-10 NOTE — Clinical Social Work Note (Signed)
Clinical Social Work Assessment  Patient Details  Name: Carrie Robbins MRN: [id number] Date of Birth: 1951-09-11  Date of referral:  05/10/18               Reason for consult:  Facility Placement                Permission sought to share information with:  Case Manager, Customer service manager, Family Supports Permission granted to share information::  Yes, Verbal Permission Granted  Name::     Carrie Robbins and Armed forces logistics/support/administrative officer::  Hospice  Relationship::  Daughters  Contact Information:     Housing/Transportation Living arrangements for the past 2 months:  Single Family Home Source of Information:  Adult Children Patient Interpreter Needed:  None Criminal Activity/Legal Involvement Pertinent to Current Situation/Hospitalization:  No - Comment as needed Significant Relationships:  Adult Children, Other Family Members Lives with:  Self Do you feel safe going back to the place where you live?  No Need for family participation in patient care:  Yes (Comment)  Care giving concerns:  No care giving concerns at the time of assessment.    Social Worker assessment / plan:  LCSW consulted for residential hospice.   Patient is a 67 y.o. female with medical history significant of triple negative breast cancer s/p resection in 2018, colon cancer s/p resection in February 2009, diabetes mellitus type 2, depression, and schizophrenia; who presents with 4 weeks of progressively worsening nonproductive cough.   LCSW met at bedside with patient. Patient's family is present including patients daughters and and other family members. LCSW explained roll and reason for visit.   Patient and family interested in residential hospice. Family asked questions about location of hospice facilities to their home, logistics and time frame. LCSW answered family questions.   Family has a clear understanding of residential hospice.   Family prefers [facility].   PLAN: Residential hospice at dc.     Employment status:  Disabled (Comment on whether or not currently receiving Disability) Insurance information:  Medicare PT Recommendations:  Not assessed at this time Information / Referral to community resources:     Patient/Family's Response to care:  Patient and family thankful for LCSW visit and coordination with dc planning.   Patient/Family's Understanding of and Emotional Response to Diagnosis, Current Treatment, and Prognosis:  Patient and family has a clear understanding of prognosis and treatment plan. Patient and family agreeable to residential hospice.   Emotional Assessment Appearance:  Appears stated age Attitude/Demeanor/Rapport:    Affect (typically observed):  Calm, Accepting, Pleasant Orientation:  Oriented to Self, Oriented to Place, Oriented to  Time, Oriented to Situation Alcohol / Substance use:  Not Applicable Psych involvement (Current and /or in the community):  No (Comment)  Discharge Needs  Concerns to be addressed:  No discharge needs identified Readmission within the last 30 days:  No Current discharge risk:  None Barriers to Discharge:  Hospice Bed not available   Servando Snare, LCSW 05/10/2018, 11:02 AM

## 2018-05-10 NOTE — Progress Notes (Signed)
Per family request. LCSW made [facility] referral.  [place]

## 2018-05-10 NOTE — Discharge Summary (Addendum)
Physician Discharge Summary  Carrie Robbins YQM:[id number] DOB: 08/07/1951 DOA: 05/09/2018  PCP: Celene Squibb, MD  Admit date: 05/09/2018 Discharge date: 05/10/2018  Admitted From: Home Disposition:  Home  Discharge Condition:Stable CODE STATUS: DNR Diet recommendation: Regular  Brief/Interim Summary: Admission:  HPI: Carrie Robbins is a 67 y.o. female with medical history significant of triple negative breast cancer s/p resection in 2018, colon cancer s/p resection in February 2009, diabetes mellitus type 2, depression, and schizophrenia; who presents with 4 weeks of progressively worsening nonproductive cough.  Patient lives with daughter and family family helping to care for her at since 2018.  Patient had been treated by her primary care provider with several rounds of antibiotics including azithromycin, amoxicillin, Diflucan, and most recently started on doxycycline yesterday for treatment of a possible bacterial source as cause of symptoms.  Family note that she is also tried multiple different cough medicines including onces with codeine without much relief of symptoms.  She was even tried on a steroid Dosepak which she completed 3 to 4 days ago and had been doing better initially.  However, has rapidly declined over the last several days.  Patient reportedly lost 6 pounds in the last few days related to decreased appetite.  Associated symptoms include progressively worsening lower extremity swelling, elevated blood sugars as high as 577 at home, generalized weakness unable to ambulate on her own as she previously could, decreased urine output in last 48 hours, foul-smelling urine, intermittent wheezing, and pain secondary to coughing.  Patient having to sit up due to worsening cough with laying down.   Denies any significant fever, chills, hemoptysis, nausea, or vomiting.   Her husband died earlier this year and this time she just wants to be comfortable.  She was told that she was not a  candidate for any additional chemotherapy after being seen by oncology back in June .  Patient reports at this time she wants to be  comfortable and not having any aggressive therapies.  CODE STATUS was discussed and patient reports that she does not want any resuscitative efforts if she is to pass away. Patient would ultimately like to go home.  ED Course: Upon admission into the emergency department patient was seen to be afebrile, pulse 84-86, respiration 15-22, blood pressure 104/40 -144/81, and O2 saturation maintained on room air.  Labs revealed WBC 18.9, hemoglobin 10.8, sodium 130, potassium 5.9, chloride 95, BUN 99, creatinine 3.88, glucose 366, lactic acid 2.67. CT scan showed interval progression of mediastinal lymphadenopathy with bilateral pulmonary masses.  Urinalysis had not been obtained yet.  Sepsis protocol was initiated with full fluid bolus and empiric antibiotics of vancomycin and cefepime.   Hospital Course: Patient seen and examined the bedside this morning.  Daughter was at the bedside .Patient was in mild to moderate respiratory distress.  She continued to cough.  After extensive discussion with the daughter at the bedside, she  has decided to pursue residential hospice services for her mom.  She thinks that is the best decision for her mom and her mom  wants to be comfortable and does not want to go through aggressive measures anymore.She understands that her cancer has progressed to the point that there is no treatment and any measures after this point is futile.  It will only cause prolonged pain and suffering for her. I consulted social worker for contacting residential hospice today.  She has a bed available at [facility] .Patient will be [facility].  Discharge Diagnoses:  Active Problems:   History of colon cancer   Metastatic breast cancer (HCC)   SIRS (systemic inflammatory response syndrome) (HCC)   Type 2 diabetes mellitus with  hyperglycemia (HCC)   Hyperkalemia   Normocytic normochromic anemia    Discharge Instructions  Discharge Instructions    Diet - low sodium heart healthy   Complete by:  As directed    Discharge instructions   Complete by:  As directed    Follow up with residential Hospice.   Increase activity slowly   Complete by:  As directed      Allergies as of 05/10/2018      Reactions   Combivent Respimat [ipratropium-albuterol]    "can't breathe"   Excedrin Extra Strength [asa-apap-caff Buffered]    nervous   Metformin And Related    Mood swings, diarrhea      Medication List    STOP taking these medications   amoxicillin 500 MG capsule Commonly known as:  AMOXIL   aspirin EC 81 MG tablet   atorvastatin 20 MG tablet Commonly known as:  LIPITOR   azithromycin 250 MG tablet Commonly known as:  ZITHROMAX   busPIRone 7.5 MG tablet Commonly known as:  BUSPAR   clonazePAM 0.5 MG tablet Commonly known as:  KLONOPIN   doxycycline 100 MG capsule Commonly known as:  MONODOX   fluconazole 150 MG tablet Commonly known as:  DIFLUCAN   FLUoxetine 20 MG tablet Commonly known as:  PROZAC   fluticasone 50 MCG/ACT nasal spray Commonly known as:  FLONASE   furosemide 40 MG tablet Commonly known as:  LASIX   glimepiride 4 MG tablet Commonly known as:  AMARYL   lisinopril-hydrochlorothiazide 20-25 MG tablet Commonly known as:  PRINZIDE,ZESTORETIC   omeprazole 40 MG capsule Commonly known as:  PRILOSEC   potassium chloride 10 MEQ tablet Commonly known as:  K-DUR   predniSONE 10 MG tablet Commonly known as:  DELTASONE   risperiDONE 1 MG tablet Commonly known as:  RISPERDAL   TRADJENTA 5 MG Tabs tablet Generic drug:  linagliptin   traMADol 50 MG tablet Commonly known as:  ULTRAM   traZODone 50 MG tablet Commonly known as:  DESYREL   TRESIBA FLEXTOUCH 100 UNIT/ML Sopn FlexTouch Pen Generic drug:  insulin degludec   VIRTUSSIN A/C 100-10 MG/5ML syrup Generic  drug:  guaiFENesin-codeine       Allergies  Allergen Reactions  . Combivent Respimat [Ipratropium-Albuterol]     "can't breathe"  . Excedrin Extra Strength [Asa-Apap-Caff Buffered]     nervous  . Metformin And Related     Mood swings, diarrhea    Consultations: None  Procedures/Studies: Dg Chest 2 View  Result Date: 05/09/2018 CLINICAL DATA:  Bilateral lung, breast and colon cancer with cough. EXAM: CHEST - 2 VIEW COMPARISON:  Chest CT 03/31/2018 FINDINGS: Pulmonary masses are redemonstrated throughout both lungs, with interval increase in size since prior chest CT compatible with slight worsening of known pulmonary metastasis. Dominant mass in the right lung measures 3.6 cm versus 2.6 cm previously projecting over the right mid lung. A 2.8 cm mass in the left lung apex is noted previously measuring 2.4 cm. Additional smaller lesions are identified, some which are obscured by the cardiac silhouette. Heart size is top-normal. Nonaneurysmal thoracic aorta is redemonstrated with central vascular congestion. No acute pulmonary consolidation, effusion or pneumothorax. No aggressive osseous lesions. Degenerative changes present along the dorsal spine. IMPRESSION: Redemonstration of several pulmonary masses throughout both lungs with interval increase  in size of some of the more dominant masses since prior exam, largest in the right mid lung measuring to 3.6 cm currently. Mild central vascular congestion is noted without acute pulmonary consolidations. Electronically Signed   By: Ashley Royalty M.D.   On: 05/09/2018 20:33   Ct Head Wo Contrast  Result Date: 05/09/2018 CLINICAL DATA:  Altered level of consciousness. Patient with listed history of breast cancer, colon cancer, and lung cancer. EXAM: CT HEAD WITHOUT CONTRAST TECHNIQUE: Contiguous axial images were obtained from the base of the skull through the vertex without intravenous contrast. COMPARISON:  None. FINDINGS: Despite 2 scan acquisitions  there is significant patient motion artifact, examination is near nondiagnostic. Brain: No hydrocephalus. No evidence of large intracranial bleed or large territorial infarct. More detailed assessment is limited. Vascular: Limited assessment for hyperdense vessel due to motion. Skull: No gross focal lesion or fracture. Sinuses/Orbits: Grossly normal. Other: None. IMPRESSION: Significant patient motion artifact, despite repeat scan acquisition, examination is near nondiagnostic. No gross acute abnormality is seen. Consider follow-up exam when patient is able based on clinical concern. Electronically Signed   By: Jeb Levering M.D.   On: 05/09/2018 23:20   Ct Chest Wo Contrast  Result Date: 05/09/2018 CLINICAL DATA:  Lung cancer.  Cough. EXAM: CT CHEST WITHOUT CONTRAST TECHNIQUE: Multidetector CT imaging of the chest was performed following the standard protocol without IV contrast. COMPARISON:  03/31/2018 FINDINGS: Cardiovascular: Heart size upper normal. No pericardial effusion. Atherosclerotic calcification is noted in the wall of the thoracic aorta. Mediastinum/Nodes: Similar appearance of mediastinal lymphadenopathy. Index precarinal lymph node measured at 17 mm previously is 19 mm. No evidence for gross hilar lymphadenopathy although assessment is limited by the lack of intravenous contrast on today's study. The esophagus has normal imaging features. There is no axillary lymphadenopathy. Lungs/Pleura: Bilateral pulmonary nodules and masses are again noted. Left apical lesion measured previously at 2.4 cm is 2.6 cm today. 3.2 cm right lower lobe mass on today's exam was 2.3 cm when I remeasure it in a similar fashion on the prior study. 19 mm nodule in the posterior left costophrenic sulcus on today's study was 14 mm previously. No pulmonary edema or pleural effusion. Upper Abdomen: Unremarkable. Musculoskeletal: No worrisome lytic or sclerotic osseous abnormality. IMPRESSION: Interval progression of  mediastinal lymphadenopathy and bilateral pulmonary nodules / masses. Electronically Signed   By: Misty Stanley M.D.   On: 05/09/2018 23:20   US Renal  Result Date: 05/10/2018 CLINICAL DATA:  Renal failure. EXAM: RENAL / URINARY TRACT ULTRASOUND COMPLETE COMPARISON:  Abdominal CT 03/11/2018 FINDINGS: Right Kidney: Length: 10 cm. Subcentimeter cyst on prior CT is not seen sonographically. Echogenicity within normal limits. No mass or hydronephrosis visualized. Left Kidney: Length: 10.4 cm. Echogenicity within normal limits. No mass or hydronephrosis visualized. Bladder: Appears normal for degree of bladder distention. Left ureteral jet visualized. Right ureteral jet not seen. IMPRESSION: No obstructive uropathy.  Unremarkable renal ultrasound. Electronically Signed   By: Jeb Levering M.D.   On: 05/10/2018 01:51       Subjective: Patient seen and examined the bedside this morning.  Remains very weak and uncomfortable with cough.  She was clearly in respiratory distress.  Daughter present at the bedside.  Discharge Exam: Vitals:   05/10/18 1255 05/10/18 1327  BP:  (!) 126/56  Pulse:  84  Resp:  20  Temp:  98 F (36.7 C)  SpO2: 92% 93%   Vitals:   05/10/18 0432 05/10/18 0852 05/10/18 1255 05/10/18 1327  BP:    (!) 126/56  Pulse:    84  Resp:    20  Temp:    98 F (36.7 C)  TempSrc:    Oral  SpO2: 92% 94% 92% 93%  Weight:      Height:        General: Pt is alert, awake, in mild to moderate respiratory distress Cardiovascular: RRR, S1/S2 +, no rubs, no gallops Respiratory: Bilateral decreased air entry, coarse breathing sounds  abdominal: Soft, NT, mildly distended, bowel sounds + Extremities: Bilateral severe lower extremity edema, no cyanosis    The results of significant diagnostics from this hospitalization (including imaging, microbiology, ancillary and laboratory) are listed below for reference.     Microbiology: No results found for this or any previous visit (from  the past 240 hour(s)).   Labs: BNP (last 3 results) Recent Labs    05/09/18 2008  BNP 02.5   Basic Metabolic Panel: Recent Labs  Lab 05/09/18 2008 05/10/18 0505  NA 130* 135  K 5.9* 4.7  CL 95* 103  CO2 23 23  GLUCOSE 366* 104*  BUN 99* 88*  CREATININE 3.88* 2.89*  CALCIUM 8.3* 7.7*   Liver Function Tests: Recent Labs  Lab 05/09/18 2008  AST 32  ALT 12  ALKPHOS 65  BILITOT 1.4*  PROT 6.2*  ALBUMIN 2.6*   No results for input(s): LIPASE, AMYLASE in the last 168 hours. No results for input(s): AMMONIA in the last 168 hours. CBC: Recent Labs  Lab 05/09/18 2008 05/10/18 0505  WBC 18.9* 17.9*  NEUTROABS 14.6*  --   HGB 10.8* 10.0*  HCT 33.7* 32.2*  MCV 87.8 89.2  PLT 339 284   Cardiac Enzymes: Recent Labs  Lab 05/09/18 2008 05/10/18 0505  CKTOTAL  --  827*  TROPONINI <0.03  --    BNP: Invalid input(s): POCBNP CBG: Recent Labs  Lab 05/09/18 1950 05/10/18 0133 05/10/18 0422 05/10/18 0753 05/10/18 1128  GLUCAP 361* 176* 110* 98 156*   D-Dimer No results for input(s): DDIMER in the last 72 hours. Hgb A1c No results for input(s): HGBA1C in the last 72 hours. Lipid Profile No results for input(s): CHOL, HDL, LDLCALC, TRIG, CHOLHDL, LDLDIRECT in the last 72 hours. Thyroid function studies No results for input(s): TSH, T4TOTAL, T3FREE, THYROIDAB in the last 72 hours.  Invalid input(s): FREET3 Anemia work up No results for input(s): VITAMINB12, FOLATE, FERRITIN, TIBC, IRON, RETICCTPCT in the last 72 hours. Urinalysis    Component Value Date/Time   COLORURINE YELLOW 05/10/2018 0050   APPEARANCEUR CLEAR 05/10/2018 0050   LABSPEC 1.014 05/10/2018 0050   PHURINE 5.0 05/10/2018 0050   GLUCOSEU NEGATIVE 05/10/2018 0050   HGBUR MODERATE (A) 05/10/2018 0050   BILIRUBINUR NEGATIVE 05/10/2018 0050   KETONESUR NEGATIVE 05/10/2018 0050   PROTEINUR NEGATIVE 05/10/2018 0050   NITRITE NEGATIVE 05/10/2018 0050   LEUKOCYTESUR TRACE (A) 05/10/2018 0050    Sepsis Labs Invalid input(s): PROCALCITONIN,  WBC,  LACTICIDVEN Microbiology No results found for this or any previous visit (from the past 240 hour(s)).  Please note: You were cared for by a hospitalist during your hospital stay. Once you are discharged, your primary care physician will handle any further medical issues. Please note that NO REFILLS for any discharge medications will be authorized once you are discharged, as it is imperative that you return to your primary care physician (or establish a relationship with a primary care physician if you do not have one) for your post hospital discharge needs  so that they can reassess your need for medications and monitor your lab values.    Time coordinating discharge: 40 minutes  SIGNED:   Shelly Coss, MD  [facility] 05/10/2018, 1:28 PM Pager [phone]  If 7PM-7AM, please contact night-coverage [email or web address] Password [email or web address]

## 2018-05-10 NOTE — Care Management CC44 (Signed)
Condition Code 44 Documentation Completed  Patient Details  Name: Carrie Robbins MRN: [id number] Date of Birth: 1950/10/25   Condition Code 44 given:  Yes Patient signature on Condition Code 44 notice:  Yes Documentation of 2 MD's agreement:  Yes Code 44 added to claim:  Yes    Erenest Rasher, RN 05/10/2018, 3:30 PM

## 2018-05-12 ENCOUNTER — Encounter (HOSPITAL_COMMUNITY): Payer: Self-pay

## 2018-05-12 ENCOUNTER — Encounter (HOSPITAL_COMMUNITY)
Admission: RE | Admit: 2018-05-12 | Discharge: 2018-05-12 | Disposition: A | Discharge status: Home / Self Care | Payer: Medicare Other | Source: Ambulatory Visit | Attending: Internal Medicine | Admitting: Internal Medicine

## 2018-05-12 ENCOUNTER — Telehealth: Payer: Self-pay | Admitting: Internal Medicine

## 2018-05-12 NOTE — Telephone Encounter (Signed)
FYI to AB.  

## 2018-05-12 NOTE — Telephone Encounter (Signed)
Called Kim in endo and made aware to cancel procedure/pre-op.

## 2018-05-12 NOTE — Consult Note (Signed)
Consultation Note Date: 05/12/2018   Patient Name: Carrie Robbins  DOB: [birth year]  MRN: [id number]  Age / Sex: 67 y.o., female  PCP: Celene Squibb, MD Referring Physician: Tawanna Solo  Reason for Consultation: Establishing goals of care, Hospice Evaluation and Terminal Care  HPI/Patient Profile: 67 y.o. female  with past medical history of triple negative breast cancer with resection in 2018, colon cancer with resection 2009, diabetes mellitus, depression, schizophrenia admitted on 05/09/2018 with acute renal failure and concern for infection with meeting of sirs criteria.  Clinical Assessment and Goals of Care: I met today with patient.  No family at bedside. We discussed clinical course as well as wishes moving forward in regard to care.  We discussed difference between a aggressive medical intervention path and a palliative, comfort focused care path.  Values and goals of care important to patient and family were attempted to be elicited.  She has been discussing with her family as well as primary hospitalist regarding a plan to transition to full comfort care and transition to residential hospice for end-of-life support.  She understands that goal moving full be focus only on interventions for her comfort with discontinuation of antibiotics and other life prolonging measures.  She reports to me that her desires to [facility] to [facility] for end-of-life care.  Questions and concerns addressed.   PMT will continue to support holistically.  SUMMARY OF RECOMMENDATIONS   - Plan for residential hospice for EOL care  Code Status/Advance Care Planning:  DNR Prognosis:   < 2 weeks.  She has end-stage cancer that is further complicated by likely infection with no plan for further antibiotics as well as kidney failure.  I believe her prognosis if her disease follows natural course to be less than 2 weeks and she is  continuing to have uncontrolled symptoms including increasing shortness of breath.  She would best be served by transition to residential hospice if it can be arranged.  Discharge Planning: Hospice facility      Primary Diagnoses: Present on Admission: . SIRS (systemic inflammatory response syndrome) (HCC) . Metastatic breast cancer ([place]) . History of colon cancer . Type 2 diabetes mellitus with hyperglycemia ([place]) . Hyperkalemia . Normocytic normochromic anemia   I have reviewed the medical record, interviewed the patient and family, and examined the patient. The following aspects are pertinent.  Past Medical History:  Diagnosis Date  . Anxiety   . Arthritis   . Breast cancer ([place]) 03/2017   left breast, [place]  . Colon cancer ([place])    about 10 years ago  . Depression   . Diabetes ([place])   . GERD (gastroesophageal reflux disease)   . Lung cancer [facility])    Social History   Socioeconomic History  . Marital status: Married    Spouse name: Not on file  . Number of children: Not on file  . Years of education: Not on file  . Highest education level: Not on file  Occupational History  . Not on file  Social Needs  .  Financial resource strain: Not on file  . Food insecurity:    Worry: Not on file    Inability: Not on file  . Transportation needs:    Medical: Not on file    Non-medical: Not on file  Tobacco Use  . Smoking status: Never Smoker  . Smokeless tobacco: Never Used  Substance and Sexual Activity  . Alcohol use: Never    Frequency: Never  . Drug use: Never  . Sexual activity: Not on file  Lifestyle  . Physical activity:    Days per week: Not on file    Minutes per session: Not on file  . Stress: Not on file  Relationships  . Social connections:    Talks on phone: Not on file    Gets together: Not on file    Attends religious service: Not on file    Active member of club or organization: Not on file    Attends meetings of clubs or organizations: Not  on file    Relationship status: Not on file  Other Topics Concern  . Not on file  Social History Narrative  . Not on file   Family History  Problem Relation Age of Onset  . Stomach cancer Mother   . Liver cancer Maternal Grandmother   . Colon cancer Neg Hx    Scheduled Meds: Continuous Infusions: PRN Meds:. Medications Prior to Admission:  Prior to Admission medications   Not on File   Allergies  Allergen Reactions  . Combivent Respimat [Ipratropium-Albuterol]     "can't breathe"  . Excedrin Extra Strength [Asa-Apap-Caff Buffered]     nervous  . Metformin And Related     Mood swings, diarrhea   Review of Systems  + SOB  Physical Exam  General: Alert, awake, mild increase resp effort HEENT: No bruits, no goiter, no JVD Heart: Regular rate and rhythm. No murmur appreciated. Lungs: Good air movement, clear Abdomen: Soft, nontender, nondistended, positive bowel sounds.  Ext: No significant edema Skin: Warm and dry Neuro: Grossly intact, nonfocal.   Vital Signs: BP (!) 126/56 (BP Location: Right Arm)   Pulse 84   Temp 98 F (36.7 C) (Oral)   Resp 20   Ht _0  (1.651 m)   Wt 121.3 kg (267 lb 6.7 oz)   SpO2 93%   BMI 44.50 kg/m  Pain Scale: 0-10   Pain Score: Asleep   SpO2: SpO2: 93 % O2 Device:SpO2: 93 % O2 Flow Rate: .   IO: Intake/output summary: No intake or output data in the 24 hours ending 05/12/18 1202  LBM: Last BM Date: 05/07/18 Baseline Weight: Weight: 117.9 kg (260 lb) Most recent weight: Weight: 121.3 kg (267 lb 6.7 oz)     Palliative Assessment/Data:   Flowsheet Rows     Most Recent Value  Intake Tab  Referral Department  -- [ED]  Unit at Time of Referral  ER  Date Notified  05/10/18  Palliative Care Type  Not seen  Reason Not Seen  Discharged before seen  Reason for referral  End of Life Care Assistance  Date of Admission  05/09/18  # of days IP prior to Palliative referral  1  Clinical Assessment  Psychosocial & Spiritual  Assessment  Palliative Care Outcomes     Time Total: 40 minutes Greater than 50%  of this time was spent counseling and coordinating care related to the above assessment and plan.  Signed by: Micheline Rough, MD   Please contact Palliative Medicine Team phone  at [phone] for questions and concerns.  For individual provider: See Shea Evans

## 2018-05-12 NOTE — Telephone Encounter (Signed)
Pt's daughter called to say that we needed to cancel her pre op and her procedure with RMR on 8/12. Patient is in hospice house.

## 2018-05-13 NOTE — Telephone Encounter (Signed)
Noted. Sorry to hear this.  

## 2018-05-15 LAB — CULTURE, BLOOD (ROUTINE X 2)
CULTURE: NO GROWTH
CULTURE: NO GROWTH
Special Requests: ADEQUATE

## 2018-05-18 ENCOUNTER — Encounter (HOSPITAL_COMMUNITY): Admission: RE | Payer: Self-pay | Source: Ambulatory Visit

## 2018-05-18 ENCOUNTER — Ambulatory Visit (HOSPITAL_COMMUNITY): Admission: RE | Admit: 2018-05-18 | Payer: Medicare Other | Source: Ambulatory Visit | Admitting: Internal Medicine

## 2018-05-18 SURGERY — ESOPHAGOGASTRODUODENOSCOPY (EGD) WITH PROPOFOL
Anesthesia: Monitor Anesthesia Care

## 2018-06-07 DEATH — deceased

## 2020-05-18 IMAGING — CT CT CHEST W/ CM
2 of 4 series · 15 of 36 positions shown, 18 images · IV contrast (iopamidol)
Comparison: CT 03/11/2018

CLINICAL DATA: Pulmonary nodules.  For history of colon cancer

EXAM:
CT CHEST WITH CONTRAST
TECHNIQUE: Multidetector CT imaging of the chest was performed during
intravenous contrast administration.
CONTRAST:  75mL [id number] IOPAMIDOL ([id number]) INJECTION 61%

[Series 2: axial chest · axial · 0.75mm/px · z∈[-1361,-1093]mm · 12 of 160 slices shown, 15 images]
[im 13/160  mediastinal]
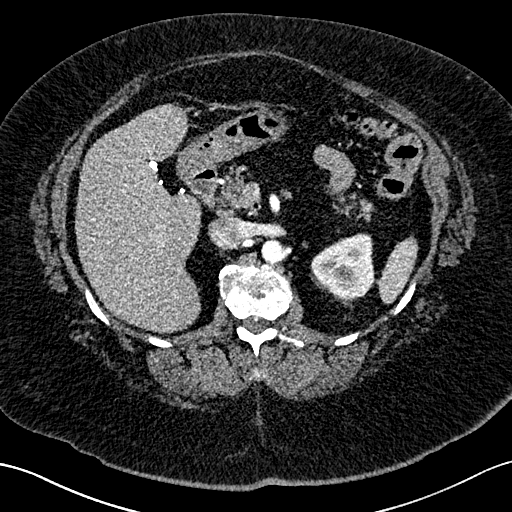
[im 13/160  lung]
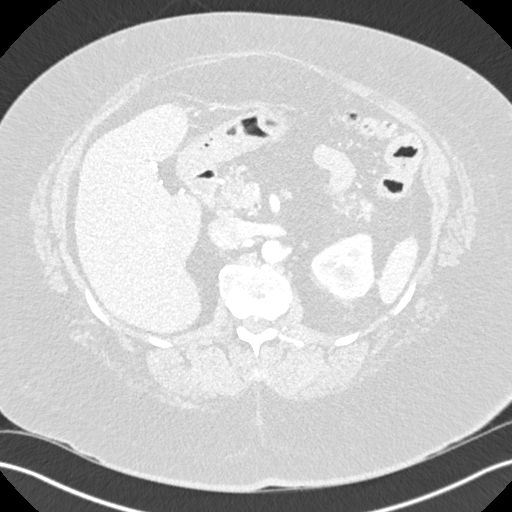
[im 25/160  lung]
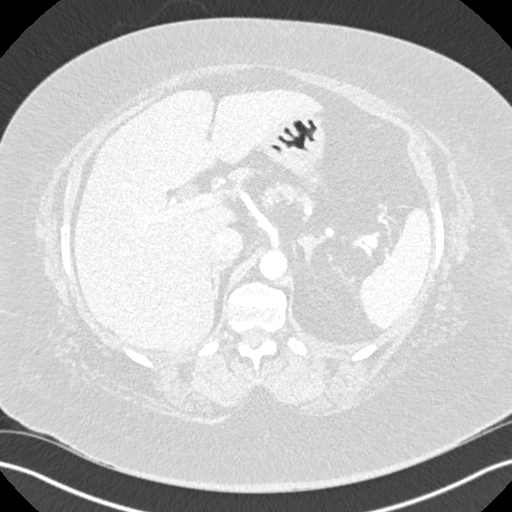
[im 37/160  lung]
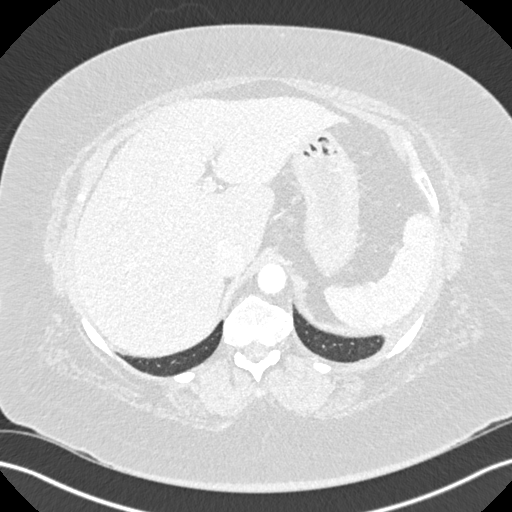
[im 49/160  lung]
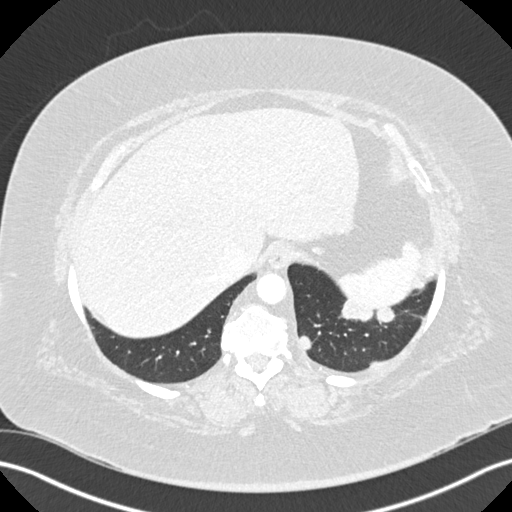
[im 62/160  mediastinal]
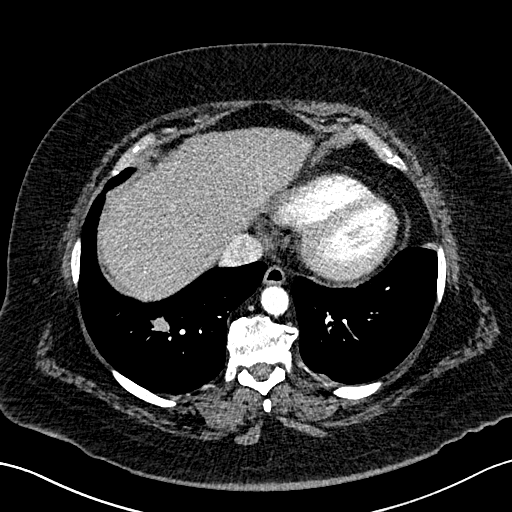
[im 62/160  lung]
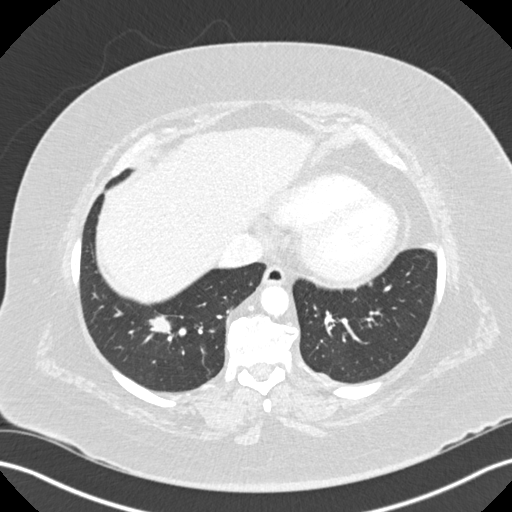
[im 74/160  lung]
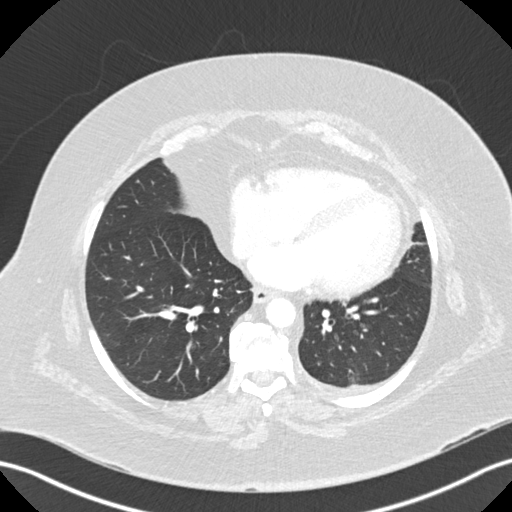
[im 86/160  lung]
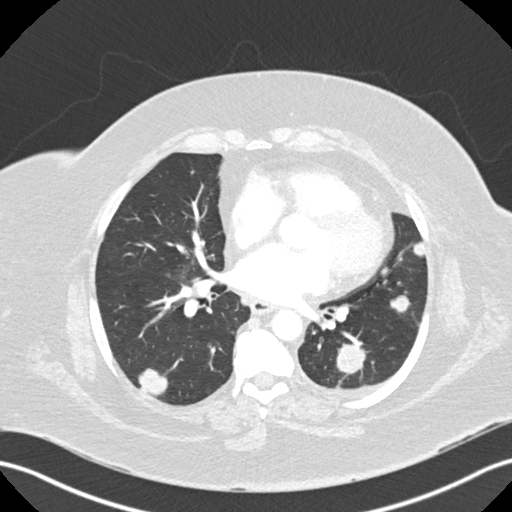
[im 98/160  lung]
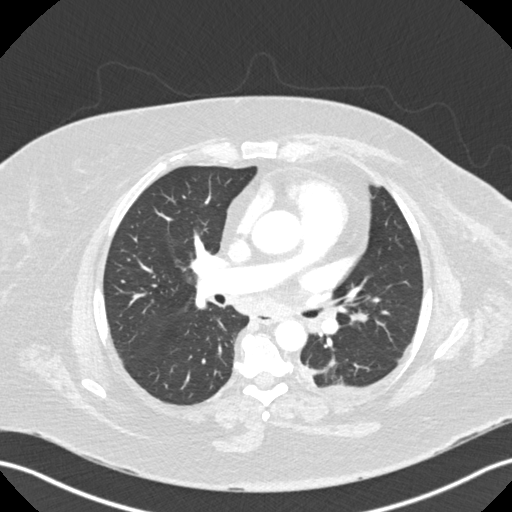
[im 111/160  mediastinal]
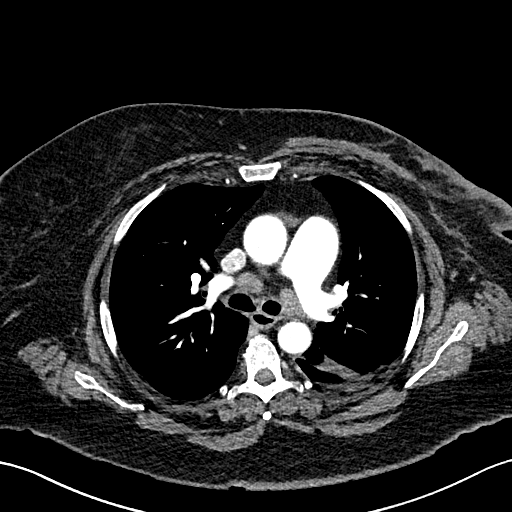
[im 111/160  lung]
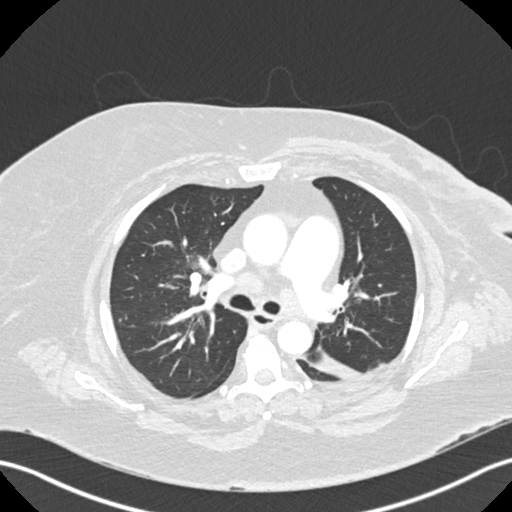
[im 123/160  lung]
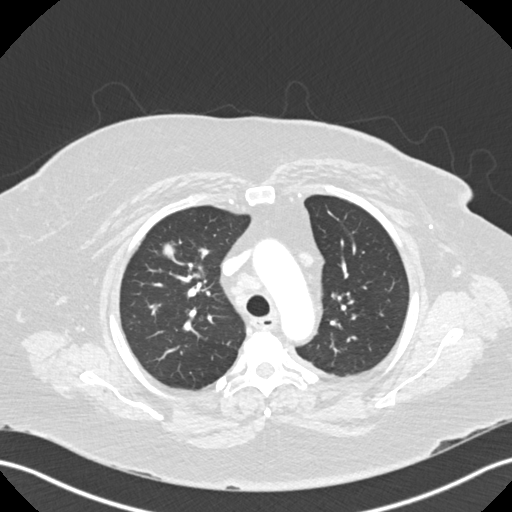
[im 135/160  lung]
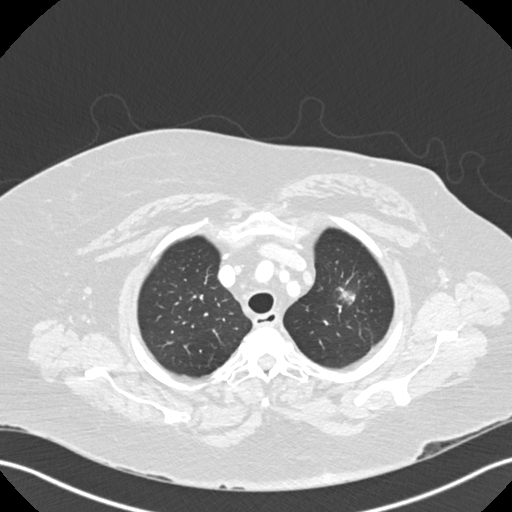
[im 147/160  lung]
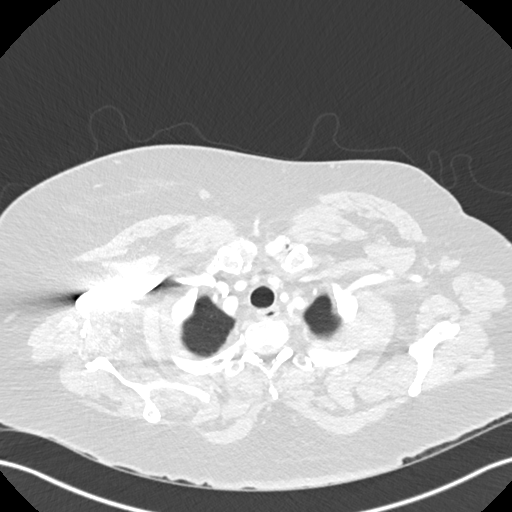

[Series 4: coronal chest · coronal · 0.63mm/px · 3 of 182 slices shown]
[im 37/182  lung]
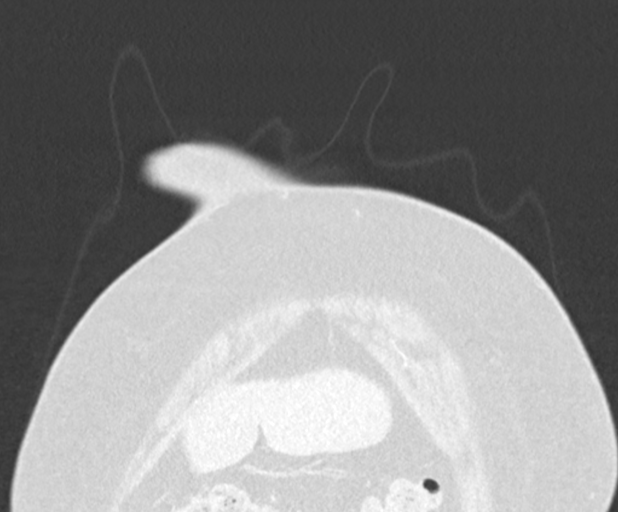
[im 73/182  lung]
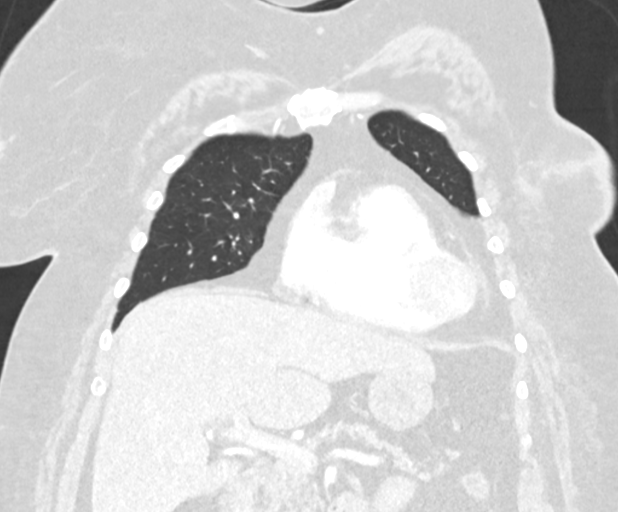
[im 109/182  lung]
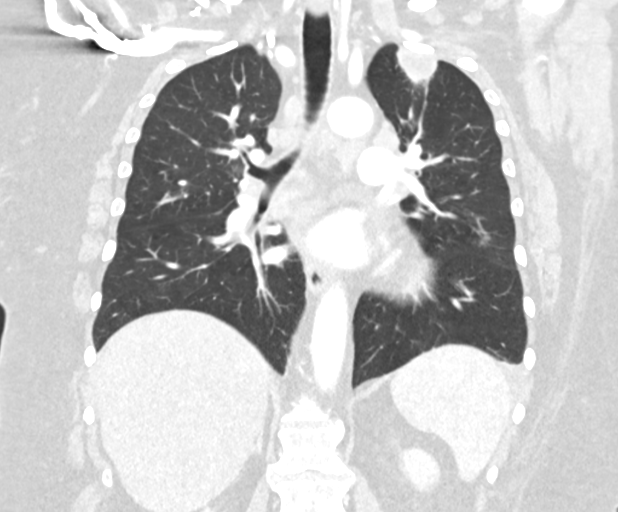

[15 of 36 positions shown; findings below may reference images not displayed]

FINDINGS: Cardiovascular: No significant vascular findings. Normal heart size.
No pericardial effusion.

Mediastinum/Nodes: No axillary or supraclavicular adenopathy.
Enlarged mediastinal lymph nodes. For example 17 mm short axis RIGHT
lower paratracheal lymph node. no supraclavicular adenopathy.

Lungs/Pleura: Multiple bilateral round pulmonary nodules of varying
sizes in the upper and lower lobes. Largest nodule in the LEFT upper
lobe measures 2.4 cm. Similar nodule in the LEFT lower lobe measures
2.4 cm. RIGHT upper lobe nodule measures 11 mm.

Approximately 6 nodules per lung.

Upper Abdomen: No focal hepatic lesion. No upper abdominal
adenopathy. Adrenal glands normal.

Musculoskeletal: No aggressive osseous lesion.
IMPRESSION: 1. Bilateral large pulmonary nodules presumed colorectal carcinoma
metastasis.
2. Metastatic mediastinal adenopathy.
